# Patient Record
Sex: Female | Born: 1990
Health system: Southern US, Community
[De-identification: ages and names within clinical notes are randomized; demographics above are authoritative.]

## PROBLEM LIST (undated history)

## (undated) DIAGNOSIS — Z8619 Personal history of other infectious and parasitic diseases: Secondary | ICD-10-CM

## (undated) DIAGNOSIS — J45909 Unspecified asthma, uncomplicated: Secondary | ICD-10-CM

## (undated) DIAGNOSIS — F32A Depression, unspecified: Secondary | ICD-10-CM

## (undated) DIAGNOSIS — I10 Essential (primary) hypertension: Secondary | ICD-10-CM

## (undated) HISTORY — DX: Essential (primary) hypertension: I10

## (undated) HISTORY — PX: OTHER SURGICAL HISTORY: SHX169

## (undated) HISTORY — DX: Personal history of other infectious and parasitic diseases: Z86.19

## (undated) HISTORY — DX: Depression, unspecified: F32.A

## (undated) HISTORY — PX: ADENOIDECTOMY: SUR15

## (undated) HISTORY — PX: TONSILLECTOMY: SUR1361

---

## 2018-09-22 ENCOUNTER — Inpatient Hospital Stay (HOSPITAL_COMMUNITY)
Admission: AD | Admit: 2018-09-22 | Discharge: 2018-09-22 | Disposition: A | Discharge status: Home / Self Care | Payer: Self-pay | Attending: Obstetrics and Gynecology | Admitting: Obstetrics and Gynecology

## 2018-09-22 ENCOUNTER — Encounter (HOSPITAL_COMMUNITY): Payer: Self-pay | Admitting: *Deleted

## 2018-09-22 ENCOUNTER — Other Ambulatory Visit: Payer: Self-pay

## 2018-09-22 DIAGNOSIS — Z3A01 Less than 8 weeks gestation of pregnancy: Secondary | ICD-10-CM | POA: Insufficient documentation

## 2018-09-22 DIAGNOSIS — O219 Vomiting of pregnancy, unspecified: Secondary | ICD-10-CM

## 2018-09-22 HISTORY — DX: Unspecified asthma, uncomplicated: J45.909

## 2018-09-22 LAB — URINALYSIS, ROUTINE W REFLEX MICROSCOPIC
Bacteria, UA: NONE SEEN
Bilirubin Urine: NEGATIVE
Glucose, UA: NEGATIVE mg/dL
Hgb urine dipstick: NEGATIVE
Ketones, ur: NEGATIVE mg/dL
Leukocytes,Ua: NEGATIVE
Nitrite: NEGATIVE
Protein, ur: NEGATIVE mg/dL
Specific Gravity, Urine: 1.003 — ABNORMAL LOW (ref 1.005–1.030)
pH: 7 (ref 5.0–8.0)

## 2018-09-22 LAB — POCT PREGNANCY, URINE: Preg Test, Ur: POSITIVE — AB

## 2018-09-22 MED ORDER — DOXYLAMINE-PYRIDOXINE 10-10 MG PO TBEC: 2.0000 | DELAYED_RELEASE_TABLET | Freq: Every day | ORAL | 5 refills | Status: DC

## 2018-09-22 MED ORDER — PROMETHAZINE HCL 25 MG/ML IJ SOLN
25.0000 mg | Freq: Once | INTRAMUSCULAR | Status: AC
  Administered 2018-09-22: 25 mg via INTRAMUSCULAR
  Filled 2018-09-22: qty 1

## 2018-09-22 NOTE — MAU Note (Signed)
Last period was short.  +preg test at HD last Tues.  Denies pain or bleeding.  Nauseated, not throwing up.

## 2018-09-22 NOTE — MAU Provider Note (Signed)
Chief Complaint: Nausea  First Provider Initiated Contact with Patient 09/22/18 [phone]     SUBJECTIVE HPI: Margaret Sanchez is a 28 y.o. G1P0 at 6644w1d by LMP who presents to maternity admissions reporting nausea. Reports sure LMP and that she found out she was pregnant last week. Reports some nausea starting around 0500 this morning. She has not vomited and has been tolerating PO well. Only reports the nausea and some increased saliva production. She was unable to go to work this morning.  She denies cramping, vaginal bleeding, abdominal pain, diarrhea, constipation, back pain, vaginal itching/burning/discharge, urinary symptoms, h/a, dizziness, n/v, or fever/chills.  Past Medical History:  Diagnosis Date  . Asthma    Past Surgical History:  Procedure Laterality Date  . ADENOIDECTOMY    . arm surgery Right   . TONSILLECTOMY     Social History   Socioeconomic History  . Marital status: Single    Spouse name: Not on file  . Number of children: Not on file  . Years of education: Not on file  . Highest education level: Not on file  Occupational History  . Not on file  Social Needs  . Financial resource strain: Not on file  . Food insecurity    Worry: Not on file    Inability: Not on file  . Transportation needs    Medical: Not on file    Non-medical: Not on file  Tobacco Use  . Smoking status: Never Smoker  . Smokeless tobacco: Never Used  Substance and Sexual Activity  . Alcohol use: Not Currently  . Drug use: Never  . Sexual activity: Not on file  Lifestyle  . Physical activity    Days per week: Not on file    Minutes per session: Not on file  . Stress: Not on file  Relationships  . Social Musicianconnections    Talks on phone: Not on file    Gets together: Not on file    Attends religious service: Not on file    Active member of club or organization: Not on file    Attends meetings of clubs or organizations: Not on file    Relationship status: Not on file  . Intimate  partner violence    Fear of current or ex partner: Not on file    Emotionally abused: Not on file    Physically abused: Not on file    Forced sexual activity: Not on file  Other Topics Concern  . Not on file  Social History Narrative  . Not on file   No current facility-administered medications on file prior to encounter.    No current outpatient medications on file prior to encounter.   No Known Allergies  ROS:  Review of Systems All other systems negative unless noted above in HPI.   I have reviewed patient's Past Medical Hx, Surgical Hx, Family Hx, Social Hx, medications and allergies.   Physical Exam   Patient Vitals for the past 24 hrs:  BP Temp Temp src Pulse Resp SpO2 Height Weight  09/22/18 0741 114/70 98.8 F (37.1 C) Oral 63 16 100 % 5\' 3"  (1.6 m) 87 kg   Constitutional: Well-developed, well-nourished female in no acute distress. Obese.  Cardiovascular: normal rate Respiratory: normal effort GI: Abd soft, non-tender. Pos BS x 4 MS: Extremities nontender, no edema, normal ROM Neurologic: Alert and oriented x 4.  GU: Neg CVAT.  LAB RESULTS Results for orders placed or performed during the hospital encounter of 09/22/18 (from the past 24  hour(s))  Urinalysis, Routine w reflex microscopic     Status: Abnormal   Collection Time: 09/22/18  7:39 AM  Result Value Ref Range   Color, Urine YELLOW YELLOW   APPearance CLEAR CLEAR   Specific Gravity, Urine 1.003 (L) 1.005 - 1.030   pH 7.0 5.0 - 8.0   Glucose, UA NEGATIVE NEGATIVE mg/dL   Hgb urine dipstick NEGATIVE NEGATIVE   Bilirubin Urine NEGATIVE NEGATIVE   Ketones, ur NEGATIVE NEGATIVE mg/dL   Protein, ur NEGATIVE NEGATIVE mg/dL   Nitrite NEGATIVE NEGATIVE   Leukocytes,Ua NEGATIVE NEGATIVE   RBC / HPF 0-5 0 - 5 RBC/hpf   WBC, UA 0-5 0 - 5 WBC/hpf   Bacteria, UA NONE SEEN NONE SEEN   Squamous Epithelial / LPF 0-5 0 - 5  Pregnancy, urine POC     Status: Abnormal   Collection Time: 09/22/18  7:39 AM  Result  Value Ref Range   Preg Test, Ur POSITIVE (A) NEGATIVE       IMAGING No results found.  MAU Management/MDM: Orders Placed This Encounter  Procedures  . Urinalysis, Routine w reflex microscopic  . Pregnancy, urine POC  . Discharge patient Discharge disposition: 01-Home or Self Care; Discharge patient date: 09/22/2018    Meds ordered this encounter  Medications  . promethazine (PHENERGAN) injection 25 mg  . Doxylamine-Pyridoxine (DICLEGIS) 10-10 MG TBEC    Sig: Take 2 tablets by mouth at bedtime. If symptoms persist, add one tablet in the morning and one in the afternoon    Dispense:  100 tablet    Refill:  5    Patient presenting with nausea in early pregnancy only. She is without pain, bleeding or any other concern and has not yet vomited. Phenergan given in MAU. Patient reported an improvement in symptoms and felt comfortable discharging home. Partner able to provide transportation home. Diclegis prescribed on discharge. Pt discharged with strict return precautions.  ASSESSMENT 1. Nausea and vomiting in pregnancy prior to [redacted] weeks gestation     PLAN Discharge home Allergies as of 09/22/2018   No Known Allergies     Medication List    TAKE these medications   Doxylamine-Pyridoxine 10-10 MG Tbec Commonly known as: Diclegis Take 2 tablets by mouth at bedtime. If symptoms persist, add one tablet in the morning and one in the afternoon      [place] [facility] Follow up in 4 week(s).   Specialty: Obstetrics and Gynecology Contact information: [address] [place], [place] [phone] [place] [phone] [place], [place] Fellow, [facility] for [facility], [facility] 09/22/2018  9:05 AM

## 2018-10-27 ENCOUNTER — Other Ambulatory Visit (HOSPITAL_COMMUNITY): Payer: Self-pay | Admitting: Nurse Practitioner

## 2018-10-27 DIAGNOSIS — Z369 Encounter for antenatal screening, unspecified: Secondary | ICD-10-CM

## 2018-10-29 LAB — CYTOLOGY - PAP: Pap: NEGATIVE

## 2018-11-04 ENCOUNTER — Encounter (HOSPITAL_COMMUNITY): Payer: Self-pay

## 2018-11-04 ENCOUNTER — Ambulatory Visit (HOSPITAL_COMMUNITY): Payer: Self-pay

## 2018-11-04 ENCOUNTER — Ambulatory Visit (HOSPITAL_COMMUNITY): Payer: Self-pay | Attending: Obstetrics and Gynecology

## 2018-12-28 DIAGNOSIS — J4520 Mild intermittent asthma, uncomplicated: Secondary | ICD-10-CM | POA: Insufficient documentation

## 2018-12-28 DIAGNOSIS — Z6834 Body mass index (BMI) 34.0-34.9, adult: Secondary | ICD-10-CM | POA: Insufficient documentation

## 2018-12-28 HISTORY — DX: Mild intermittent asthma, uncomplicated: J45.20

## 2019-01-29 NOTE — L&D Delivery Note (Addendum)
OB/GYN Faculty Practice Delivery Note  Margaret Sanchez is a 29 y.o. now G1P0100 s/p SVD at [redacted]w[redacted]d who was admitted for IUFD.   ROM: 5h 65m with brown fluid GBS Status: Unknown Maximum Maternal Temperature: 99.0  Delivery Note At 6:20 PM a non-viable female was delivered via Vaginal, Spontaneous (Presentation: Left Occiput Posterior).  APGAR: n/a weight pending.   Placenta status: Spontaneous, Intact via Tomasa Blase, Abnormal, small in size with 3/4 of the diameter covered by blood clot on maternal side.  Cord: burgandy in color, minimal twisting of vessels with the following complications: Short.  Cord clamped then cut by FOB, [place]. CordCord pH: not done  Anesthesia: Epidural Episiotomy: None Lacerations: None Suture Repair: none Est. Blood Loss (mL): 50  Mom to OBSCU.  Baby to with parents then to morgue. Maternal Grandmother, Doris, present immediately after delivery for support of mom and FOB.  Postpartum Planning [x]  Mom stable prior to transfer to antepartum unit.  Mom desires autopsy of baby.  , MSN, CNM 04/20/19, 6:50 PM

## 2019-04-19 ENCOUNTER — Other Ambulatory Visit: Payer: Self-pay

## 2019-04-19 ENCOUNTER — Encounter (HOSPITAL_COMMUNITY): Payer: Self-pay | Admitting: Obstetrics and Gynecology

## 2019-04-19 ENCOUNTER — Inpatient Hospital Stay (HOSPITAL_COMMUNITY)
Admission: AD | Admit: 2019-04-19 | Discharge: 2019-04-22 | DRG: 805 | Disposition: A | Discharge status: Home / Self Care | Payer: Medicaid Other | Attending: Obstetrics and Gynecology | Admitting: Obstetrics and Gynecology

## 2019-04-19 ENCOUNTER — Inpatient Hospital Stay (HOSPITAL_BASED_OUTPATIENT_CLINIC_OR_DEPARTMENT_OTHER): Payer: Medicaid Other

## 2019-04-19 DIAGNOSIS — Z20822 Contact with and (suspected) exposure to covid-19: Secondary | ICD-10-CM | POA: Diagnosis present

## 2019-04-19 DIAGNOSIS — O99324 Drug use complicating childbirth: Secondary | ICD-10-CM | POA: Diagnosis present

## 2019-04-19 DIAGNOSIS — O3680X0 Pregnancy with inconclusive fetal viability, not applicable or unspecified: Secondary | ICD-10-CM

## 2019-04-19 DIAGNOSIS — O364XX0 Maternal care for intrauterine death, not applicable or unspecified: Principal | ICD-10-CM | POA: Diagnosis present

## 2019-04-19 DIAGNOSIS — Z3A36 36 weeks gestation of pregnancy: Secondary | ICD-10-CM

## 2019-04-19 DIAGNOSIS — R030 Elevated blood-pressure reading, without diagnosis of hypertension: Secondary | ICD-10-CM | POA: Diagnosis present

## 2019-04-19 DIAGNOSIS — F1290 Cannabis use, unspecified, uncomplicated: Secondary | ICD-10-CM | POA: Diagnosis present

## 2019-04-19 DIAGNOSIS — O169 Unspecified maternal hypertension, unspecified trimester: Secondary | ICD-10-CM

## 2019-04-19 DIAGNOSIS — O26893 Other specified pregnancy related conditions, third trimester: Secondary | ICD-10-CM | POA: Diagnosis present

## 2019-04-19 DIAGNOSIS — O4593 Premature separation of placenta, unspecified, third trimester: Secondary | ICD-10-CM | POA: Diagnosis present

## 2019-04-19 LAB — COMPREHENSIVE METABOLIC PANEL
ALT: 18 U/L (ref 0–44)
AST: 21 U/L (ref 15–41)
Albumin: 3.2 g/dL — ABNORMAL LOW (ref 3.5–5.0)
Alkaline Phosphatase: 182 U/L — ABNORMAL HIGH (ref 38–126)
Anion gap: 9 (ref 5–15)
BUN: <5 mg/dL — ABNORMAL LOW (ref 6–20)
CO2: 24 mmol/L (ref 22–32)
Calcium: 9.2 mg/dL (ref 8.9–10.3)
Chloride: 107 mmol/L (ref 98–111)
Creatinine, Ser: 0.69 mg/dL (ref 0.44–1.00)
GFR calc Af Amer: >60 mL/min (ref >60)
GFR calc non Af Amer: >60 mL/min (ref >60)
Glucose, Bld: 82 mg/dL (ref 70–99)
Potassium: 4 mmol/L (ref 3.5–5.1)
Sodium: 140 mmol/L (ref 135–145)
Total Bilirubin: 0.5 mg/dL (ref 0.3–1.2)
Total Protein: 7.5 g/dL (ref 6.5–8.1)

## 2019-04-19 LAB — CBC
HCT: 40.9 % (ref 36.0–46.0)
Hemoglobin: 13.2 g/dL (ref 12.0–15.0)
MCH: 27.1 pg (ref 26.0–34.0)
MCHC: 32.3 g/dL (ref 30.0–36.0)
MCV: 84 fL (ref 80.0–100.0)
Platelets: 430 10*3/uL — ABNORMAL HIGH (ref 150–400)
RBC: 4.87 MIL/uL (ref 3.87–5.11)
RDW: 13.9 % (ref 11.5–15.5)
WBC: 9.8 10*3/uL (ref 4.0–10.5)
nRBC: 0 % (ref 0.0–0.2)

## 2019-04-19 LAB — SARS CORONAVIRUS 2 (TAT 6-24 HRS): SARS Coronavirus 2: NEGATIVE

## 2019-04-19 LAB — ABO/RH: ABO/RH(D): O POS

## 2019-04-19 LAB — TYPE AND SCREEN
ABO/RH(D): O POS
Antibody Screen: NEGATIVE

## 2019-04-19 LAB — PROTEIN / CREATININE RATIO, URINE
Creatinine, Urine: 98.8 mg/dL
Total Protein, Urine: <6 mg/dL

## 2019-04-19 IMAGING — US US MFM OB LIMITED
1 series · 11 of 11 positions shown · non-contrast
Comparison: none

1  US MFM OB LIMITED                    76815.01     [facility]
 ----------------------------------------------------------------------

Indications
  36 weeks gestation of pregnancy
  Unable to hear fetal heart tones as reason     O76
  for ultrasound
Fetal Evaluation
 Num Of Fetuses:         1
 Cardiac Activity:       Absent
 Presentation:           Cephalic
 Placenta:               Posterior
OB History
 Gravidity:    1
Gestational Age
 LMP:           36w 0d        Date:  [DATE]                 EDD:   [DATE]
 Best:          36w 0d     Det. By:  LMP  ([DATE])          EDD:   [DATE]
Impression
 Patient was evaluated at the [facility] after intrauterine fetal
 demise was suspected on office ultrasound.
 On ultrasound, unfortunately, fetal heart activity was absent.
 Minimal pleural effusion is seen. Cephalic presentation.

[Series 1: us mfm ob limited · 11 acquisitions, 11 frames shown]
[im 1/11]
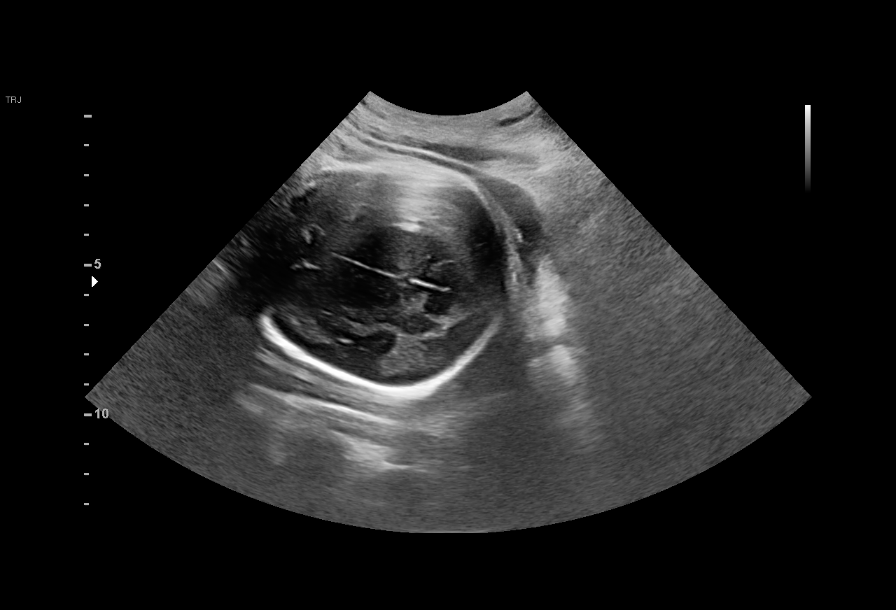
[im 2/11]
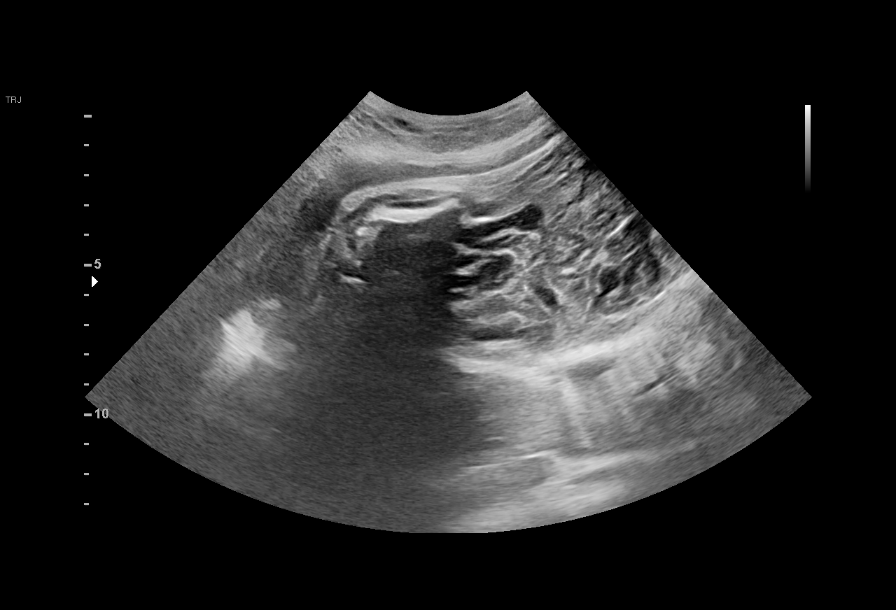
[im 3/11]
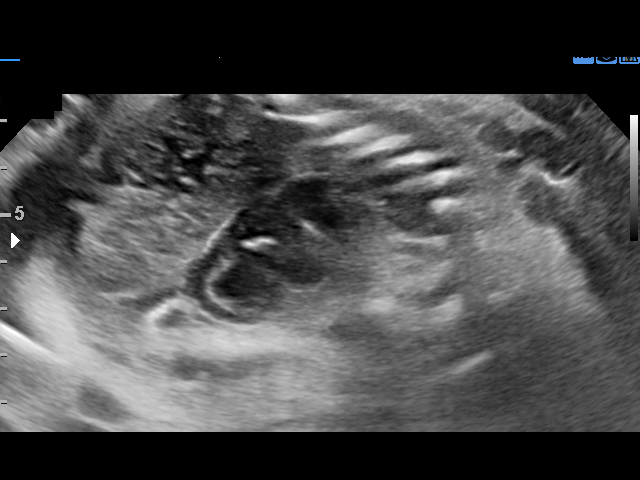
[im 4/11]
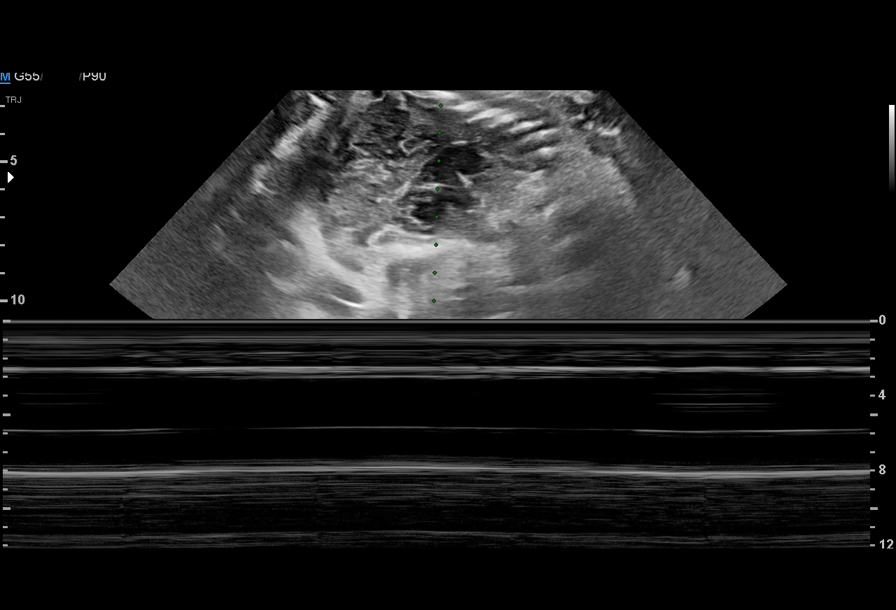
[im 5/11]
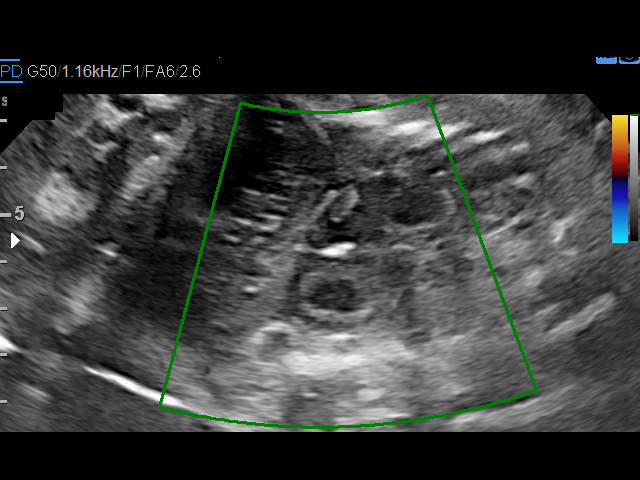
[im 6/11]
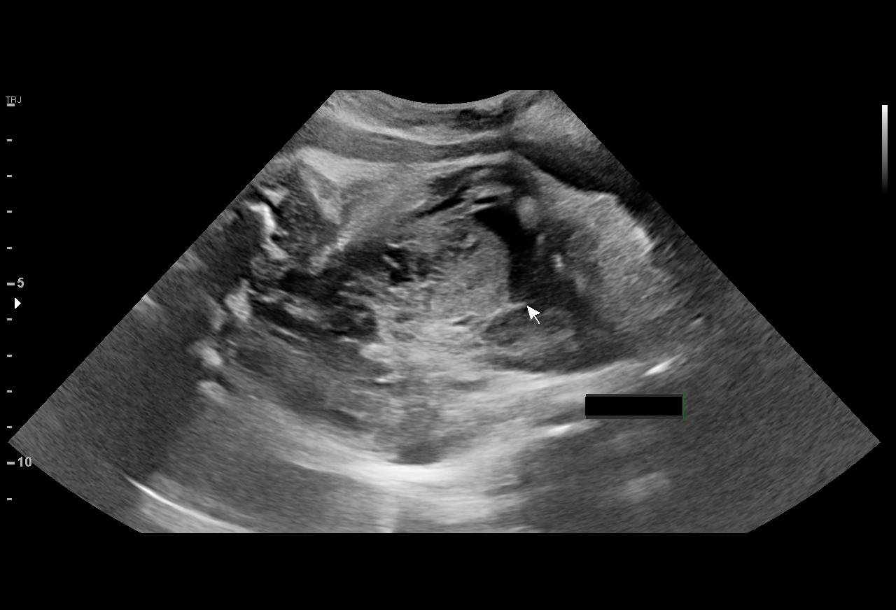
[im 7/11]
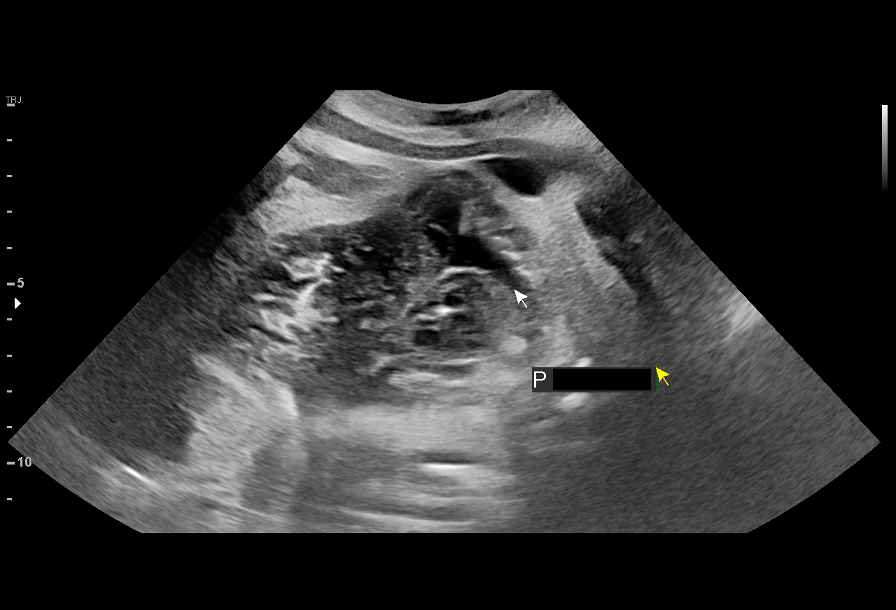
[im 8/11]
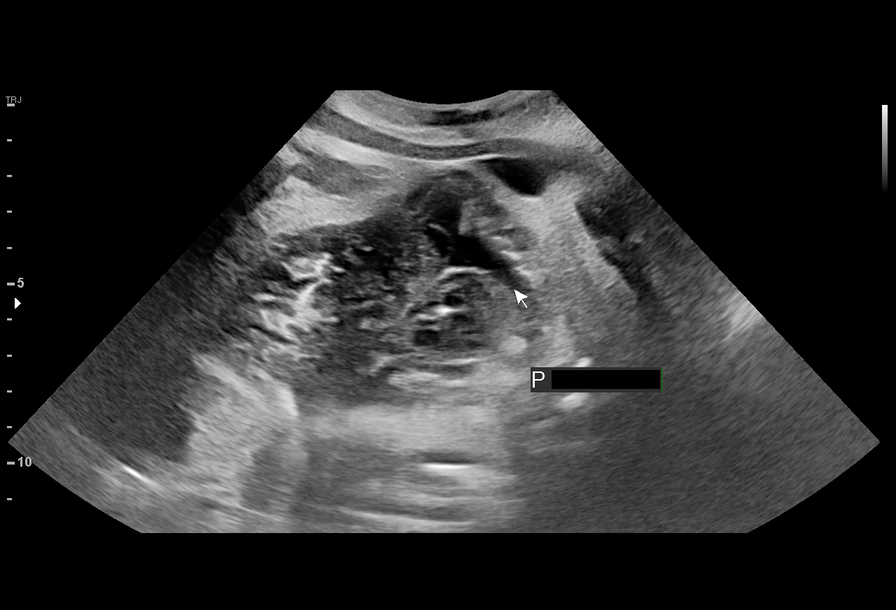
[im 9/11]
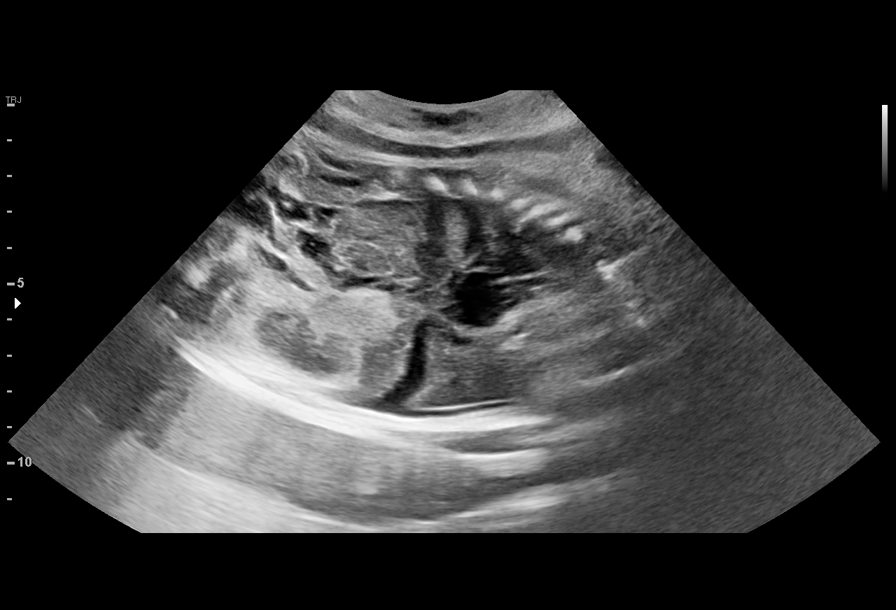
[im 10/11]
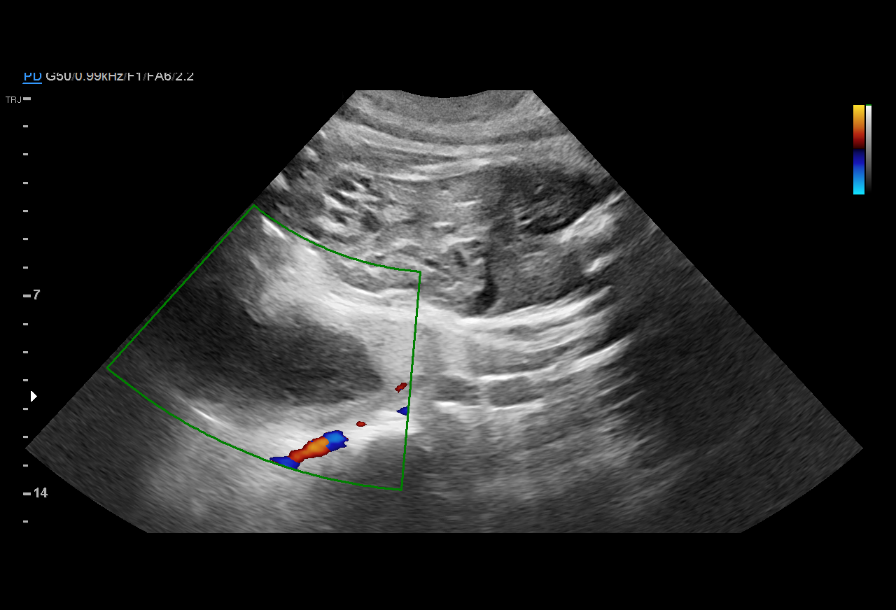
[im 11/11]
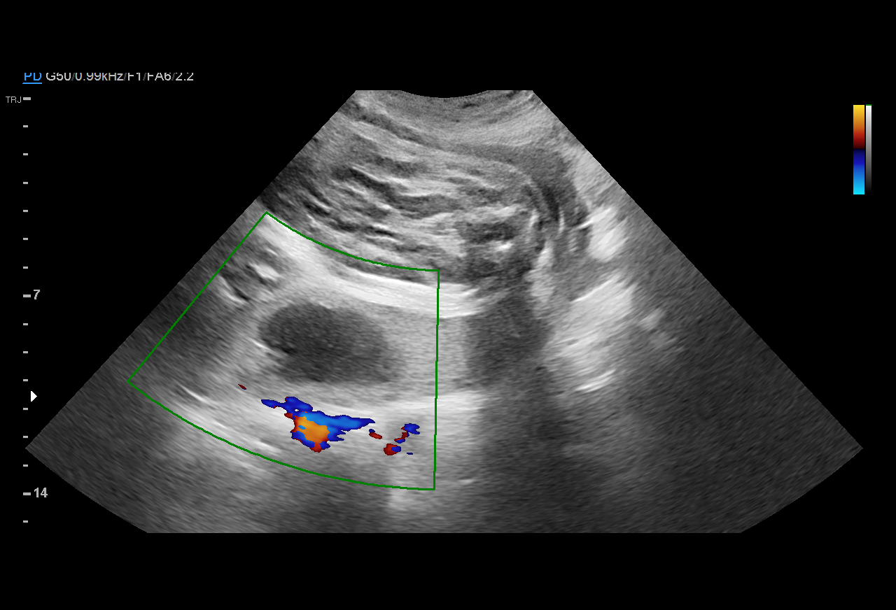

[11 of 11 positions shown; findings below may reference images not displayed]

IMPRESSION: Fetal Demise.
Recommendations

 -CMV/Toxo screening to be included in stillbirth evaluation.
                 [facility]

## 2019-04-19 MED ORDER — MISOPROSTOL 200 MCG PO TABS
400.0000 ug | ORAL_TABLET | ORAL | Status: DC | PRN
  Administered 2019-04-20: 400 ug via VAGINAL
  Filled 2019-04-19: qty 2

## 2019-04-19 MED ORDER — LABETALOL HCL 5 MG/ML IV SOLN: 20.0000 mg | INTRAVENOUS | Status: DC | PRN

## 2019-04-19 MED ORDER — HYDRALAZINE HCL 20 MG/ML IJ SOLN: 5.0000 mg | INTRAMUSCULAR | Status: DC | PRN

## 2019-04-19 MED ORDER — FENTANYL CITRATE (PF) 100 MCG/2ML IJ SOLN
100.0000 ug | INTRAMUSCULAR | Status: DC | PRN
  Administered 2019-04-20 (×6): 100 ug via INTRAVENOUS
  Filled 2019-04-19 (×6): qty 2

## 2019-04-19 MED ORDER — OXYCODONE-ACETAMINOPHEN 5-325 MG PO TABS: 2.0000 | ORAL_TABLET | ORAL | Status: DC | PRN

## 2019-04-19 MED ORDER — ZOLPIDEM TARTRATE 5 MG PO TABS
5.0000 mg | ORAL_TABLET | Freq: Every evening | ORAL | Status: DC | PRN
  Administered 2019-04-19: 5 mg via ORAL
  Filled 2019-04-19: qty 1

## 2019-04-19 MED ORDER — LACTATED RINGERS IV SOLN: INTRAVENOUS | Status: DC

## 2019-04-19 MED ORDER — MISOPROSTOL 200 MCG PO TABS
200.0000 ug | ORAL_TABLET | ORAL | Status: DC | PRN
  Administered 2019-04-19: 200 ug via VAGINAL
  Filled 2019-04-19: qty 1

## 2019-04-19 MED ORDER — OXYCODONE-ACETAMINOPHEN 5-325 MG PO TABS: 1.0000 | ORAL_TABLET | ORAL | Status: DC | PRN

## 2019-04-19 MED ORDER — ACETAMINOPHEN 325 MG PO TABS: 650.0000 mg | ORAL_TABLET | ORAL | Status: DC | PRN

## 2019-04-19 MED ORDER — OXYTOCIN 40 UNITS IN NORMAL SALINE INFUSION - SIMPLE MED: 2.5000 [IU]/h | INTRAVENOUS | Status: DC

## 2019-04-19 MED ORDER — MISOPROSTOL 200 MCG PO TABS
ORAL_TABLET | ORAL | Status: AC
  Administered 2019-04-19: 23:00:00 400 ug
  Filled 2019-04-19: qty 3

## 2019-04-19 MED ORDER — SOD CITRATE-CITRIC ACID 500-334 MG/5ML PO SOLN: 30.0000 mL | ORAL | Status: DC | PRN

## 2019-04-19 MED ORDER — HYDRALAZINE HCL 20 MG/ML IJ SOLN: 10.0000 mg | INTRAMUSCULAR | Status: DC | PRN

## 2019-04-19 MED ORDER — LABETALOL HCL 5 MG/ML IV SOLN: 40.0000 mg | INTRAVENOUS | Status: DC | PRN

## 2019-04-19 MED ORDER — ONDANSETRON HCL 4 MG/2ML IJ SOLN
4.0000 mg | Freq: Four times a day (QID) | INTRAMUSCULAR | Status: DC | PRN
  Administered 2019-04-20: 4 mg via INTRAVENOUS
  Filled 2019-04-19: qty 2

## 2019-04-19 MED ORDER — LIDOCAINE HCL (PF) 1 % IJ SOLN: 30.0000 mL | INTRAMUSCULAR | Status: DC | PRN

## 2019-04-19 MED ORDER — LACTATED RINGERS IV SOLN: 500.0000 mL | INTRAVENOUS | Status: DC | PRN

## 2019-04-19 MED ORDER — OXYTOCIN BOLUS FROM INFUSION
500.0000 mL | Freq: Once | INTRAVENOUS | Status: AC
  Administered 2019-04-20: 500 mL via INTRAVENOUS

## 2019-04-19 NOTE — H&P (Signed)
Margaret Sanchez is a 29 y.o. female G1P0 at 36.0wks presenting for IOL due to IUFD, initially dx with no FHT at her regular OB appt today, followed by formal U/S upon arrival to MAU. States she had been feeling FM regularly. Denies pain, ctx, leaking or bldg. No H/A, N/V or visual disturbances. She had some initial elevated BPs in MAU with some intermittent normal BPs. Her preg has been followed by the [facility] since 11wks and has been remarkable for:  # +THC in Sept 2020 & Feb 2021 # +chlamydia in Sept with neg TOC in Feb # hx asthma (hasn't required tx recently) # care in [place], [place] from Nov-Feb (can see visits x 2 in [place])  OB History    Gravida  1   Para      Term      Preterm      AB      Living        SAB      TAB      Ectopic      Multiple      Live Births             Past Medical History:  Diagnosis Date  . Asthma    Past Surgical History:  Procedure Laterality Date  . ADENOIDECTOMY    . arm surgery Right   . TONSILLECTOMY     Family History: family history is not on file. Social History:  reports that she has never smoked. She has never used smokeless tobacco. She reports previous alcohol use. She reports that she does not use drugs.     Maternal Diabetes: No Genetic Screening: Declined Maternal Ultrasounds/Referrals: Normal Fetal Ultrasounds or other Referrals:  None Maternal Substance Abuse:  Yes:  Type: Marijuana Significant Maternal Medications:  None Significant Maternal Lab Results:  None Other Comments:  IUFD dx today  Review of Systems History   Blood pressure 135/81, pulse 80, temperature 98.4 F (36.9 C), temperature source Oral, resp. rate 18, last menstrual period 08/10/2018, SpO2 97 %. Exam Physical Exam  Constitutional: She is oriented to person, place, and time. She appears well-developed.  HENT:  Head: Normocephalic.  Cardiovascular: Normal rate.  Respiratory: Effort normal.  Musculoskeletal:        General:  Normal range of motion.     Cervical back: Normal range of motion.  Neurological: She is alert and oriented to person, place, and time.  Skin: Skin is warm and dry.  Psychiatric: She has a normal mood and affect. Her behavior is normal. Thought content normal.    Prenatal labs: ABO, Rh:   O positive (10/26/18) Antibody:   negative (10/26/18) Rubella:   immune 10/26/18) RPR:   NR (10/26/18) HBsAg:   NR (10/26/18) HIV:   NR (10/26/18) GBS:   unknown  MFM U/S: absent cardiac activity; cepahlic presentation, post placenta, of note: ascites and pleural effusion noted  Urine P/C ratio: too low too calculate  CBC    Component Value Date/Time   WBC 9.8 04/19/2019 1640   RBC 4.87 04/19/2019 1640   HGB 13.2 04/19/2019 1640   HCT 40.9 04/19/2019 1640   PLT 430 (H) 04/19/2019 1640   MCV 84.0 04/19/2019 1640   MCH 27.1 04/19/2019 1640   MCHC 32.3 04/19/2019 1640   RDW 13.9 04/19/2019 1640   CMP     Component Value Date/Time   NA 140 04/19/2019 1640   K 4.0 04/19/2019 1640   CL 107 04/19/2019 1640   CO2 24  04/19/2019 1640   GLUCOSE 82 04/19/2019 1640   BUN <5 (L) 04/19/2019 1640   CREATININE 0.69 04/19/2019 1640   CALCIUM 9.2 04/19/2019 1640   PROT 7.5 04/19/2019 1640   ALBUMIN 3.2 (L) 04/19/2019 1640   AST 21 04/19/2019 1640   ALT 18 04/19/2019 1640   ALKPHOS 182 (H) 04/19/2019 1640   BILITOT 0.5 04/19/2019 1640   GFRNONAA >60 04/19/2019 1640   GFRAA >60 04/19/2019 1640    Assessment/Plan: IUFD@36 .0wks Elevated BPs- normal labs Unfavorable cx  Admit to Labor and Delivery Will continue to watch BPs> dx of gHTN if elevated values persist >4hrs apart Pt appropriately grieved- condolences provided to pt and family, including chaplain services Will plan on cx ripening with cytotec when pt ready UDS pending   Arabella Merles CNM 04/19/2019, 5:32 PM

## 2019-04-19 NOTE — Progress Notes (Signed)
Patient Vitals for the past 4 hrs:  BP Temp Temp src Pulse Resp  04/19/19 2254 129/79 -- -- (!) 59 --  04/19/19 1951 (!) 149/91 98.7 F (37.1 C) Oral 62 16   Sleeping.  Ctx q 2 minutes or so. Cx anterior/1/thick/-2.  Cytotec 400 mcg placed in posterior vaginal fornix.

## 2019-04-19 NOTE — MAU Note (Signed)
Patient repetitively stating she "Doesn't want to be here anymore" grandmother stated before she left that the patient states she wants to give up on life. Provider notified. Social work consulted.

## 2019-04-19 NOTE — MAU Note (Signed)
Ultrasound at bedside for formal evaluation

## 2019-04-19 NOTE — MAU Note (Signed)
Donia Ast, NP at bedside for evaluation of FHR with informal bedside US

## 2019-04-19 NOTE — MAU Provider Note (Signed)
History     CSN: [id number]  Arrival date and time: 04/19/19 1557   First Provider Initiated Contact with Patient 04/19/19 [phone]      Chief Complaint  Patient presents with  . sent from health dep., could not find fetal heart tones   Ms. Margaret Sanchez is a 29 y.o. G1P0 at [redacted]w[redacted]d who presents to MAU for absent fetal heart tones. Patient was seen at [facility] today for regular prenatal visit and reported normal fetal movement, but they were unable to find fetal heart tones and did not have an ultrasonographer present on site today, so patient was sent to MAU for evaluation.  Pt does not have any additional concerns today.  Patient also with elevated pressure on admission to MAU.   OB History    Gravida  1   Para      Term      Preterm      AB      Living        SAB      TAB      Ectopic      Multiple      Live Births              Past Medical History:  Diagnosis Date  . Asthma     Past Surgical History:  Procedure Laterality Date  . ADENOIDECTOMY    . arm surgery Right   . TONSILLECTOMY      History reviewed. No pertinent family history.  Social History   Tobacco Use  . Smoking status: Never Smoker  . Smokeless tobacco: Never Used  Substance Use Topics  . Alcohol use: Not Currently  . Drug use: Never    Allergies: No Known Allergies  Medications Prior to Admission  Medication Sig Dispense Refill Last Dose  . Doxylamine-Pyridoxine (DICLEGIS) 10-10 MG TBEC Take 2 tablets by mouth at bedtime. If symptoms persist, add one tablet in the morning and one in the afternoon 100 tablet 5     Review of Systems  Constitutional: Negative for chills, diaphoresis, fatigue and fever.  Eyes: Negative for visual disturbance.  Respiratory: Negative for shortness of breath.   Cardiovascular: Negative for chest pain.  Gastrointestinal: Negative for abdominal pain, constipation, diarrhea, nausea and vomiting.  Genitourinary: Negative for dysuria, flank pain,  frequency, pelvic pain, urgency, vaginal bleeding and vaginal discharge.  Neurological: Negative for dizziness, weakness, light-headedness and headaches.   Physical Exam   Blood pressure (!) 130/108, pulse 77, temperature 98.4 F (36.9 C), temperature source Oral, resp. rate 18, last menstrual period 08/10/2018, SpO2 97 %.  Patient Vitals for the past 24 hrs:  BP Temp Temp src Pulse Resp SpO2  04/19/19 1746 (!) 130/108 -- -- 77 -- --  04/19/19 1731 112/63 -- -- (!) 52 -- --  04/19/19 1716 135/81 -- -- 80 -- --  04/19/19 1701 (!) 158/110 -- -- 82 -- --  04/19/19 1646 138/87 -- -- 65 -- --  04/19/19 1631 (!) 142/93 -- -- 63 -- --  04/19/19 1616 (!) 150/96 -- -- 65 -- --  04/19/19 1609 (!) 165/100 98.4 F (36.9 C) Oral 64 18 97 %   Physical Exam  Constitutional: She is oriented to person, place, and time. She appears well-developed and well-nourished. No distress.  HENT:  Head: Normocephalic and atraumatic.  Respiratory: Effort normal.  GI: Soft. She exhibits no distension and no mass. There is no abdominal tenderness. There is no rebound and no guarding.  Neurological: She is  alert and oriented to person, place, and time.  Skin: Skin is warm and dry. She is not diaphoretic.  Psychiatric: She has a normal mood and affect. Her behavior is normal. Judgment and thought content normal.   Results for orders placed or performed during the hospital encounter of 04/19/19 (from the past 24 hour(s))  Protein / creatinine ratio, urine     Status: None (Preliminary result)   Collection Time: 04/19/19  4:30 PM  Result Value Ref Range   Creatinine, Urine 98.80 mg/dL   Total Protein, Urine PENDING mg/dL   Protein Creatinine Ratio PENDING 0.00 - 0.15 mg/mg[Cre]  Comprehensive metabolic panel     Status: Abnormal   Collection Time: 04/19/19  4:40 PM  Result Value Ref Range   Sodium 140 135 - 145 mmol/L   Potassium 4.0 3.5 - 5.1 mmol/L   Chloride 107 98 - 111 mmol/L   CO2 24 22 - 32 mmol/L    Glucose, Bld 82 70 - 99 mg/dL   BUN <5 (L) 6 - 20 mg/dL   Creatinine, Ser 1.61 0.44 - 1.00 mg/dL   Calcium 9.2 8.9 - 09.6 mg/dL   Total Protein 7.5 6.5 - 8.1 g/dL   Albumin 3.2 (L) 3.5 - 5.0 g/dL   AST 21 15 - 41 U/L   ALT 18 0 - 44 U/L   Alkaline Phosphatase 182 (H) 38 - 126 U/L   Total Bilirubin 0.5 0.3 - 1.2 mg/dL   GFR calc non Af Amer >60 >60 mL/min   GFR calc Af Amer >60 >60 mL/min   Anion gap 9 5 - 15  CBC     Status: Abnormal   Collection Time: 04/19/19  4:40 PM  Result Value Ref Range   WBC 9.8 4.0 - 10.5 K/uL   RBC 4.87 3.87 - 5.11 MIL/uL   Hemoglobin 13.2 12.0 - 15.0 g/dL   HCT 04.5 40.9 - 81.1 %   MCV 84.0 80.0 - 100.0 fL   MCH 27.1 26.0 - 34.0 pg   MCHC 32.3 30.0 - 36.0 g/dL   RDW 91.4 78.2 - 95.6 %   Platelets 430 (H) 150 - 400 K/uL   nRBC 0.0 0.0 - 0.2 %  Type and screen [facility]     Status: None   Collection Time: 04/19/19  4:40 PM  Result Value Ref Range   ABO/RH(D) O POS    Antibody Screen NEG    Sample Expiration      04/22/2019,2359 Performed at [facility], [address] [place], [place] [address]   ABO/Rh     Status: None (Preliminary result)   Collection Time: 04/19/19  4:40 PM  Result Value Ref Range   ABO/RH(D)      O POS Performed at [facility], [address], [place], [place] [address]    No results found.  MAU Course  Procedures  MDM -fetal demise at 36 weeks with elevated blood pressures in MAU -preeclampsia protocol ordered and labs drawn -Pt informed that the ultrasound is considered a limited OB ultrasound and is not intended to be a complete ultrasound exam.  Patient also informed that the ultrasound is not being completed with the intent of assessing for fetal or placental anomalies or any pelvic abnormalities. Explained that the purpose of today's ultrasound is to assess for viability (no cardiac activity identified).  Patient acknowledges the purpose of the exam and the limitations of the  study.   -formal ultrasound confirms  absent FHR, cephalic presentation -CBC: platelets 430, otherwise WNL -CMP: pending at time of transfer to L&D -PCr: pending at time of transfer to L&D -patient tearful and upset, mother requesting Chaplain who was called by RN and will arrive after completing services at another hospital -social work consult ordered and patient stating that she "wants to give up" -consulted with Dr. Debroah Loop about second severe range blood pressure taken during height of patient distress, per Dr. Debroah Loop, do not need to treat with protocol at this time given intermittent normal pressures -admit to L&D for induction  Orders Placed This Encounter  Procedures  . SARS CORONAVIRUS 2 (TAT 6-24 HRS) Nasopharyngeal Nasopharyngeal Swab    Standing Status:   Standing    Number of Occurrences:   1    Order Specific Question:   Is this test for diagnosis or screening    Answer:   Screening    Order Specific Question:   Symptomatic for COVID-19 as defined by CDC    Answer:   No    Order Specific Question:   Hospitalized for COVID-19    Answer:   No    Order Specific Question:   Admitted to ICU for COVID-19    Answer:   No    Order Specific Question:   Previously tested for COVID-19    Answer:   No    Order Specific Question:   Resident in a congregate (group) care setting    Answer:   Unknown    Order Specific Question:   Employed in healthcare setting    Answer:   Unknown    Order Specific Question:   Pregnant    Answer:   Yes    Order Specific Question:   Pre-procedural testing    Answer:   Yes  . [place] MFM OB LIMITED    Standing Status:   Standing    Number of Occurrences:   1    Order Specific Question:   Symptom/Reason for Exam    Answer:   Pregnancy with uncertain fetal viability [768115]  . Comprehensive metabolic panel    Standing Status:   Standing    Number of Occurrences:   1  . CBC    Standing Status:   Standing    Number of Occurrences:   1  . Protein /  creatinine ratio, urine    Standing Status:   Standing    Number of Occurrences:   1  . RPR    Standing Status:   Standing    Number of Occurrences:   1  . Diet regular Room service appropriate? Yes; Fluid consistency: Thin    Standing Status:   Standing    Number of Occurrences:   1    Order Specific Question:   Room service appropriate?    Answer:   Yes    Order Specific Question:   Fluid consistency:    Answer:   Thin  . Notify Physician    Confirmatory reading of BP> 160/110 15 minutes later    Standing Status:   Standing    Number of Occurrences:   1    Order Specific Question:   Notify Physician    Answer:   Temp greater than or equal to 100.4    Order Specific Question:   Notify Physician    Answer:   RR greater than 24 or less than 10    Order Specific Question:   Notify Physician    Answer:   HR greater  than 120 or less than 50    Order Specific Question:   Notify Physician    Answer:   SBP greater than 160 mmHG or less than 80 mmHG    Order Specific Question:   Notify Physician    Answer:   DBP greater than 110 mmHG or less than 45 mmHG    Order Specific Question:   Notify Physician    Answer:   Urinary output is less than 165ml for any 4 hour period  . Measure blood pressure    10 minutes after giving labetalol 40 MG IV dose.  Call MD if SBP >/= 160 or DBP >/= 110.    Standing Status:   Standing    Number of Occurrences:   1  . Vitals signs per unit policy    Standing Status:   Standing    Number of Occurrences:   1  . Notify Physician    Standing Status:   Standing    Number of Occurrences:   1    Order Specific Question:   Notify Physician    Answer:   for vaginal bleeding    Order Specific Question:   Notify Physician    Answer:   for acute abdominal pain    Order Specific Question:   Notify Physician    Answer:   for temperature >/= 100.4 F    Order Specific Question:   Notify Physician    Answer:   for significant change in vital signs    Order Specific  Question:   Notify Physician    Answer:   for non-reassuring fetal heart rate pattern    Order Specific Question:   Notify Physician    Answer:   for imminent delivery or failure to progress  . Fetal monitoring per unit policy    Standing Status:   Standing    Number of Occurrences:   1  . Activity as tolerated    Standing Status:   Standing    Number of Occurrences:   1  . Cervical Exam    Unless contraindicated, every 1-2 hours in active labor, or at nurse's discretion    Standing Status:   Standing    Number of Occurrences:   1  . Measure blood pressure post delivery every 15 min x 1 hour then every 30 min x 1 hour    Standing Status:   Standing    Number of Occurrences:   1  . Fundal check post delivery every 15 min x 1 hour then every 30 min x 1 hour    Standing Status:   Standing    Number of Occurrences:   1  . If Rapid HIV test positive or known HIV positive: initiate AZT orders    Standing Status:   Standing    Number of Occurrences:   1  . May in and out cath x 2 for inability to void    Standing Status:   Standing    Number of Occurrences:   [phone]  . Insert foley catheter    1) If straight catheterized greater than 2 times or patient unable to void post epidural placement.  2) PRN for bladder distention.      Standing Status:   Standing    Number of Occurrences:   1  . Discontinue foley prior to vaginal delivery    Standing Status:   Standing    Number of Occurrences:   1  . Initiate Carrier Fluid Protocol  Standing Status:   Standing    Number of Occurrences:   1  . Initiate Oral Care Protocol    Standing Status:   Standing    Number of Occurrences:   1  . Order Rapid HIV per protocol if no results on chart    Standing Status:   Standing    Number of Occurrences:   1  . Full code    Standing Status:   Standing    Number of Occurrences:   1  . Consult to Transition of Care Team    Fetal demise @36wks .    Standing Status:   Standing    Number of  Occurrences:   1    Order Specific Question:   Reason for Consult:    Answer:   Crisis counseling  . Nitrous Oxide 50%/Oxygen 50%    CONTRAINDICATIONS  1. Patients who cannot hold their own facemask.  2. Patients who are acutely intoxicated or have impaired consciousness.  3. Patient with known pernicious anemia or history of B12 deficiency  (Patient ttaking B12 as a nutritional supplement without a deficiency is not  Contraindicated from this therapy).  4. Patients with a known history of MTHFR gene mutation, pneumothorax, bowel  obstruction, increased intracranial pressure, intra-ocular surgery, or any  significant cardiac disease, including but not limited to valve abnormalities, CHF  and pulmonary hypertension.  5. Impaired oxygenation defined as oxygen saturation less than 95% on room air.    6. Patients physically in watertub for labor/birth.   7. Any other indication based on OB provider discretion.        Standing Status:   Standing    Number of Occurrences:   [phone]    Order Specific Question:   Route    Answer:   Inhalation    Order Specific Question:   Concentration    Answer:   50% Gas    Order Specific Question:   Indication    Answer:   Labor pain analgesic  . Type and screen [facility]    [facility]     Standing Status:   Standing    Number of Occurrences:   1  . ABO/Rh    Standing Status:   Standing    Number of Occurrences:   1  . Insert and maintain IV Line    Standing Status:   Standing    Number of Occurrences:   1  . Admit to Inpatient (patient's expected length of stay will be greater than 2 midnights or inpatient only procedure)    Standing Status:   Standing    Number of Occurrences:   1    Order Specific Question:   Hospital Area    Answer:   [facility] [[id number]]    Order Specific Question:   Level of Care    Answer:   Labor and Delivery [101]    Order Specific Question:   Covid Evaluation     Answer:   Asymptomatic Screening Protocol (No Symptoms)    Order Specific Question:   Diagnosis    Answer:   Intrauterine fetal death at 20 weeks or more of gestation [facility]    Order Specific Question:   Admitting Physician    Answer:   [[id number]] [[id number]]    Order Specific Question:   Attending Physician    Answer:   Adam Phenix [phone]    Order Specific Question:   Estimated length of stay  Answer:   past midnight tomorrow    Order Specific Question:   Certification:    Answer:   I certify this patient will need inpatient services for at least 2 midnights   Meds ordered this encounter  Medications  . AND Linked Order Group   . hydrALAZINE (APRESOLINE) injection 5 mg   . hydrALAZINE (APRESOLINE) injection 10 mg   . labetalol (NORMODYNE) injection 20 mg   . labetalol (NORMODYNE) injection 40 mg  . lactated ringers infusion  . oxytocin (PITOCIN) IV BOLUS FROM BAG  . oxytocin (PITOCIN) IV infusion 40 units in NS 1000 mL - Premix  . lactated ringers infusion 500-1,000 mL  . acetaminophen (TYLENOL) tablet 650 mg  . oxyCODONE-acetaminophen (PERCOCET/ROXICET) 5-325 MG per tablet 1 tablet  . oxyCODONE-acetaminophen (PERCOCET/ROXICET) 5-325 MG per tablet 2 tablet  . fentaNYL (SUBLIMAZE) injection 100 mcg  . ondansetron (ZOFRAN) injection 4 mg  . sodium citrate-citric acid (ORACIT) solution 30 mL  . lidocaine (PF) (XYLOCAINE) 1 % injection 30 mL  . misoprostol (CYTOTEC) tablet 200 mcg    Assessment and Plan   1. Fetal demise, greater than 22 weeks, antepartum, single or unspecified fetus   2. Pregnancy with uncertain fetal viability   3. Pregnancy with uncertain fetal viability, single or unspecified fetus   4. [redacted] weeks gestation of pregnancy   5. Elevated blood pressure affecting pregnancy, antepartum    -admit to L&D for induction  Odie Seraicole E Manju Kulkarni 04/19/2019, 6:13 PM

## 2019-04-19 NOTE — MAU Note (Signed)
Pt was sent over from the health department, two providers were unable to find fetal heart tones by doppler.

## 2019-04-20 ENCOUNTER — Inpatient Hospital Stay (HOSPITAL_COMMUNITY): Payer: Medicaid Other | Admitting: Anesthesiology

## 2019-04-20 ENCOUNTER — Encounter (HOSPITAL_COMMUNITY): Payer: Self-pay | Admitting: Obstetrics & Gynecology

## 2019-04-20 DIAGNOSIS — O364XX0 Maternal care for intrauterine death, not applicable or unspecified: Secondary | ICD-10-CM

## 2019-04-20 DIAGNOSIS — Z3A36 36 weeks gestation of pregnancy: Secondary | ICD-10-CM

## 2019-04-20 LAB — RAPID URINE DRUG SCREEN, HOSP PERFORMED
Amphetamines: NOT DETECTED
Barbiturates: NOT DETECTED
Benzodiazepines: NOT DETECTED
Cocaine: NOT DETECTED
Opiates: NOT DETECTED
Tetrahydrocannabinol: POSITIVE — AB

## 2019-04-20 LAB — RPR: RPR Ser Ql: NONREACTIVE

## 2019-04-20 MED ORDER — ONDANSETRON HCL 4 MG PO TABS: 4.0000 mg | ORAL_TABLET | ORAL | Status: DC | PRN

## 2019-04-20 MED ORDER — MISOPROSTOL 50MCG HALF TABLET
50.0000 ug | ORAL_TABLET | Freq: Once | ORAL | Status: AC
  Administered 2019-04-20: 50 ug via BUCCAL
  Filled 2019-04-20: qty 1

## 2019-04-20 MED ORDER — PRENATAL MULTIVITAMIN CH
1.0000 | ORAL_TABLET | Freq: Every day | ORAL | Status: DC
  Administered 2019-04-21: 1 via ORAL
  Filled 2019-04-20: qty 1

## 2019-04-20 MED ORDER — FENTANYL-BUPIVACAINE-NACL 0.5-0.125-0.9 MG/250ML-% EP SOLN
12.0000 mL/h | EPIDURAL | Status: DC | PRN
  Filled 2019-04-20: qty 250

## 2019-04-20 MED ORDER — ALBUTEROL SULFATE (2.5 MG/3ML) 0.083% IN NEBU: 3.0000 mL | INHALATION_SOLUTION | Freq: Four times a day (QID) | RESPIRATORY_TRACT | Status: DC | PRN

## 2019-04-20 MED ORDER — PHENYLEPHRINE 40 MCG/ML (10ML) SYRINGE FOR IV PUSH (FOR BLOOD PRESSURE SUPPORT): 80.0000 ug | PREFILLED_SYRINGE | INTRAVENOUS | Status: DC | PRN

## 2019-04-20 MED ORDER — BENZOCAINE-MENTHOL 20-0.5 % EX AERO: 1.0000 "application " | INHALATION_SPRAY | CUTANEOUS | Status: DC | PRN

## 2019-04-20 MED ORDER — ZOLPIDEM TARTRATE 5 MG PO TABS: 5.0000 mg | ORAL_TABLET | Freq: Every evening | ORAL | Status: DC | PRN

## 2019-04-20 MED ORDER — DIBUCAINE (PERIANAL) 1 % EX OINT: 1.0000 "application " | TOPICAL_OINTMENT | CUTANEOUS | Status: DC | PRN

## 2019-04-20 MED ORDER — WITCH HAZEL-GLYCERIN EX PADS: 1.0000 "application " | MEDICATED_PAD | CUTANEOUS | Status: DC | PRN

## 2019-04-20 MED ORDER — ACETAMINOPHEN 325 MG PO TABS: 650.0000 mg | ORAL_TABLET | ORAL | Status: DC | PRN

## 2019-04-20 MED ORDER — DIPHENHYDRAMINE HCL 25 MG PO CAPS: 25.0000 mg | ORAL_CAPSULE | Freq: Four times a day (QID) | ORAL | Status: DC | PRN

## 2019-04-20 MED ORDER — TETANUS-DIPHTH-ACELL PERTUSSIS 5-2.5-18.5 LF-MCG/0.5 IM SUSP: 0.5000 mL | Freq: Once | INTRAMUSCULAR | Status: DC

## 2019-04-20 MED ORDER — SENNOSIDES-DOCUSATE SODIUM 8.6-50 MG PO TABS
2.0000 | ORAL_TABLET | ORAL | Status: DC
  Administered 2019-04-21 (×2): 2 via ORAL
  Filled 2019-04-20 (×2): qty 2

## 2019-04-20 MED ORDER — EPHEDRINE 5 MG/ML INJ: 10.0000 mg | INTRAVENOUS | Status: DC | PRN

## 2019-04-20 MED ORDER — ONDANSETRON HCL 4 MG/2ML IJ SOLN: 4.0000 mg | INTRAMUSCULAR | Status: DC | PRN

## 2019-04-20 MED ORDER — FENTANYL-BUPIVACAINE-NACL 0.5-0.125-0.9 MG/250ML-% EP SOLN: 12.0000 mL/h | EPIDURAL | Status: DC | PRN

## 2019-04-20 MED ORDER — DIPHENHYDRAMINE HCL 50 MG/ML IJ SOLN: 12.5000 mg | INTRAMUSCULAR | Status: DC | PRN

## 2019-04-20 MED ORDER — LACTATED RINGERS IV SOLN
500.0000 mL | Freq: Once | INTRAVENOUS | Status: AC
  Administered 2019-04-20: 500 mL via INTRAVENOUS

## 2019-04-20 MED ORDER — ALBUTEROL SULFATE (2.5 MG/3ML) 0.083% IN NEBU: 2.5000 mg | INHALATION_SOLUTION | RESPIRATORY_TRACT | Status: DC | PRN

## 2019-04-20 MED ORDER — IBUPROFEN 600 MG PO TABS
600.0000 mg | ORAL_TABLET | Freq: Four times a day (QID) | ORAL | Status: DC
  Administered 2019-04-21 – 2019-04-22 (×5): 600 mg via ORAL
  Filled 2019-04-20 (×6): qty 1

## 2019-04-20 MED ORDER — SIMETHICONE 80 MG PO CHEW: 80.0000 mg | CHEWABLE_TABLET | ORAL | Status: DC | PRN

## 2019-04-20 MED ORDER — LIDOCAINE HCL (PF) 1 % IJ SOLN
INTRAMUSCULAR | Status: DC | PRN
  Administered 2019-04-20: 6 mL via EPIDURAL

## 2019-04-20 MED ORDER — OXYTOCIN 40 UNITS IN NORMAL SALINE INFUSION - SIMPLE MED
1.0000 m[IU]/min | INTRAVENOUS | Status: DC
  Administered 2019-04-20: 4 m[IU]/min via INTRAVENOUS
  Filled 2019-04-20: qty 1000

## 2019-04-20 MED ORDER — MISOPROSTOL 200 MCG PO TABS
600.0000 ug | ORAL_TABLET | ORAL | Status: AC | PRN
  Administered 2019-04-20: 600 ug via VAGINAL
  Filled 2019-04-20: qty 3

## 2019-04-20 MED ORDER — SODIUM CHLORIDE (PF) 0.9 % IJ SOLN
INTRAMUSCULAR | Status: DC | PRN
  Administered 2019-04-20: 12 mL/h via EPIDURAL

## 2019-04-20 NOTE — Progress Notes (Signed)
CSW received consult due to IUFD. CSW available for support secondary to [facility] and will await call from Chaplain before becoming involved.  CSW screening out referral at this time.  Lear Ng, LCSW [facility] [phone]

## 2019-04-20 NOTE — Anesthesia Procedure Notes (Signed)
Epidural Patient location during procedure: OB Start time: 04/20/2019 4:02 PM End time: 04/20/2019 4:07 PM  Staffing Anesthesiologist: Bethena Midget, MD  Preanesthetic Checklist Completed: patient identified, IV checked, site marked, risks and benefits discussed, surgical consent, monitors and equipment checked, pre-op evaluation and timeout performed  Epidural Patient position: sitting Prep: DuraPrep and site prepped and draped Patient monitoring: continuous pulse ox and blood pressure Approach: midline Location: L3-L4 Injection technique: LOR air  Needle:  Needle type: Tuohy  Needle gauge: 17 G Needle length: 9 cm and 9 Needle insertion depth: 5 cm cm Catheter type: closed end flexible Catheter size: 19 Gauge Catheter at skin depth: 10 cm Test dose: negative  Assessment Events: blood not aspirated, injection not painful, no injection resistance, no paresthesia and negative IV test

## 2019-04-20 NOTE — Discharge Summary (Signed)
Postpartum Discharge Summary     Patient Name: Margaret Sanchez DOB: [birth year] MRN: [id number]  Date of admission: 04/19/2019 Delivering Provider: Laury Deep   Date of discharge: 04/22/2019  Admitting diagnosis: Intrauterine fetal death at 95 weeks or more of gestation [O36.4XX0] Intrauterine pregnancy: [redacted]w[redacted]d    Secondary diagnosis:  Active Problems:   Intrauterine fetal death at 259weeks or more of gestation    Discharge diagnosis: Preterm Pregnancy Delivered and placental abruption. IUFD at 36 weeks                                   Augmentation: AROM, Pitocin, Cytotec and Foley Balloon  Com[facility]: [facility]  Induction of Labor With Vaginal Delivery   29y.o. yo G1P0 at 355w1das admitted to the hospital 04/19/2019 for induction of labor.  Indication for induction: IUFD.  Patient had an uncomplicated labor course as follows: Membrane Rupture Time/Date: 1:13 PM ,04/20/2019   Intrapartum Procedures: Episiotomy: None [1]                                         Lacerations:  None [1]  Patient had delivery of a Non Viable infant.  Information for the patient's newborn:  McAili, Cas[place]oy FD [[id number]]Delivery Method: Vaginal, Spontaneous(Filed from Delivery Summary)    04/20/2019  Details of delivery can be found in separate delivery note.  Patient had a routine postpartum course. Patient was noted to have elevated BP on PPD#1 and started on procardia. Patient is discharged home 04/22/19. Delivery time: 6:20 PM    Magnesium Sulfate received: No BMZ received: No Rhophylac:N/A MMR:N/A Transfusion:No  Physical exam  Vitals:   04/21/19 2055 04/22/19 0000 04/22/19 0624 04/22/19 0827  BP: 127/78 (!) 149/86 131/83 125/87  Pulse: 73 71 71 66  Resp: '18 18 17 16  ' Temp:  98 F (36.7 C) 98.1 F (36.7 C) 98.3 F (36.8 C)  TempSrc: Oral Oral Oral Oral  SpO2: 100% 100% 100% 100%  Weight:      Height:       General: alert,  cooperative and no distress Lochia: appropriate Uterine Fundus: firm DVT Evaluation: No evidence of DVT seen on physical exam. Labs: Lab Results  Component Value Date   WBC 9.8 04/19/2019   HGB 13.2 04/19/2019   HCT 40.9 04/19/2019   MCV 84.0 04/19/2019   PLT 430 (H) 04/19/2019   CMP Latest Ref Rng & Units 04/19/2019  Glucose 70 - 99 mg/dL 82  BUN 6 - 20 mg/dL <5(L)  Creatinine 0.44 - 1.00 mg/dL 0.69  Sodium 135 - 145 mmol/L 140  Potassium 3.5 - 5.1 mmol/L 4.0  Chloride 98 - 111 mmol/L 107  CO2 22 - 32 mmol/L 24  Calcium 8.9 - 10.3 mg/dL 9.2  Total Protein 6.5 - 8.1 g/dL 7.5  Total Bilirubin 0.3 - 1.2 mg/dL 0.5  Alkaline Phos 38 - 126 U/L 182(H)  AST 15 - 41 U/L 21  ALT 0 - 44 U/L 18   Edinburgh Score: No flowsheet data found.  Discharge instruction: per After Visit Summary and "Baby and Me Booklet".  After visit meds:  Allergies as of 04/22/2019   No Known Allergies     Medication List    STOP taking these medications   Doxylamine-Pyridoxine  10-10 MG Tbec Commonly known as: Diclegis     TAKE these medications   albuterol 108 (90 Base) MCG/ACT inhaler Commonly known as: VENTOLIN HFA Inhale 2 puffs into the lungs every 6 (six) hours as needed for wheezing or shortness of breath.   ibuprofen 600 MG tablet Commonly known as: ADVIL Take 1 tablet (600 mg total) by mouth every 6 (six) hours.   NIFEdipine 30 MG 24 hr tablet Commonly known as: ADALAT CC Take 1 tablet (30 mg total) by mouth 2 (two) times daily.   prenatal multivitamin Tabs tablet Take 1 tablet by mouth daily at 12 noon.       Diet: routine diet  Activity: Advance as tolerated. Pelvic rest for 6 weeks.   Outpatient follow up: 1 week for BP and mood check Follow up Appt: Future Appointments  Date Time Provider [place]  05/04/2019  8:15 AM [place]  05/19/2019 10:00 AM [place]   Follow up Visit: Follow-up Information     Department, [facility]. Schedule an appointment as soon as possible for a visit in 2 week(s).   Contact information: [place] [address] [place] for [facility] Follow up.   Specialty: Obstetrics and Gynecology Why: an appointment will be made for you to have a blood pressure check in 2 weeks Contact information: [address] [place], [place] [phone] [place] [id number] [phone]            Please schedule this patient for Postpartum visit in: 2 weeks with the following provider: Any provider In-Person For C/S patients schedule nurse incision check in weeks 2 weeks: no Low risk pregnancy complicated by: IUFD @ 36 wks Delivery mode:  SVD Anticipated Birth Control:  other/unsure PP Procedures needed: Mood check  Schedule Integrated [place] visit: yes   Newborn Data: Non-living born female "Margaret Sanchez" Birth Weight:   APGAR: n/a  Newborn Delivery   Birth date/time: 04/20/2019 18:20:00 Delivery type: Vaginal, Spontaneous      Baby Feeding: n/a Disposition:morgue   04/22/2019 Mora Bellman, MD

## 2019-04-20 NOTE — Progress Notes (Signed)
Attempted to visit with Margaret Sanchez twice today, but she was sleeping.  Pt RN, Hospital doctor, and Nurse Midwife, [place] share that patient has been tearful but coping appropriately.  Chaplain spent some time with pt's grandmother.  She shared that [facility] mother was having a lot of difficulty coping with the news of the baby [name]'s death and was afraid she would not be a good support for her daughter, so her grandmother is staying with her today in hopes that her mother can come tomorrow.  Grandmother was tearful and shared some of her concerns about her decision regarding whether or not to see the baby.  I offered space to explore her hesitation and concerns and continued expression of her emotions.    Will continue to follow.  Please page as further needs arise.  Maryanna Shape. Carley Hammed, M.Div. [facility] Chaplain Pager [phone] Office [phone]

## 2019-04-20 NOTE — Anesthesia Preprocedure Evaluation (Signed)
Anesthesia Evaluation  Patient identified by MRN, date of birth, ID band Patient awake    Reviewed: Allergy & Precautions, H&P , NPO status , Patient's Chart, lab work & pertinent test results, reviewed documented beta blocker date and time   Airway Mallampati: III  TM Distance: >3 FB Neck ROM: full    Dental no notable dental hx. (+) Teeth Intact, Dental Advisory Given   Pulmonary neg pulmonary ROS,    Pulmonary exam normal breath sounds clear to auscultation       Cardiovascular negative cardio ROS Normal cardiovascular exam Rhythm:regular Rate:Normal     Neuro/Psych negative neurological ROS  negative psych ROS   GI/Hepatic negative GI ROS, Neg liver ROS,   Endo/Other  negative endocrine ROS  Renal/GU negative Renal ROS  negative genitourinary   Musculoskeletal   Abdominal   Peds  Hematology negative hematology ROS (+)   Anesthesia Other Findings   Reproductive/Obstetrics (+) Pregnancy                             Anesthesia Physical Anesthesia Plan  ASA: III  Anesthesia Plan: Epidural   Post-op Pain Management:    Induction:   PONV Risk Score and Plan:   Airway Management Planned:   Additional Equipment:   Intra-op Plan:   Post-operative Plan:   Informed Consent: I have reviewed the patients History and Physical, chart, labs and discussed the procedure including the risks, benefits and alternatives for the proposed anesthesia with the patient or authorized representative who has indicated his/her understanding and acceptance.       Plan Discussed with:   Anesthesia Plan Comments:         Anesthesia Quick Evaluation

## 2019-04-20 NOTE — Progress Notes (Signed)
Margaret Sanchez is a 29 y.o. G1P0 at [redacted]w[redacted]d by LMP admitted for induction of labor due to IUFD.  Subjective: Discussed cervical Balloon placement and Cytotec dose change with patient and how it will help advice cervical dilation and not prolong the IOL process. No questions and desires to proceed with plan as discussed.  Objective: BP (!) 141/84   Pulse 61   Temp 98.4 F (36.9 C) (Oral)   Resp 17   Ht 5\' 4"  (1.626 m)   Wt 84.4 kg   LMP 08/10/2018 Comment: only lasted 2 days  SpO2 99%   BMI 31.93 kg/m    FHT:  n/a UC:   Irregular UC's with UI noted SVE:   Dilation: 1 Effacement (%): Thick Station: -3 Exam by:: [email or web address], CNM Cervical balloon placed without difficulty. Patient tolerated well.  Labs: Lab Results  Component Value Date   WBC 9.8 04/19/2019   HGB 13.2 04/19/2019   HCT 40.9 04/19/2019   MCV 84.0 04/19/2019   PLT 430 (H) 04/19/2019    Assessment / Plan: Induction of labor due to fetal demise  Labor: Progressing slowly. Preeclampsia:  n/a Fetal Wellbeing:  n/a Pain Control:  IV pain meds I/D:  n/a Anticipated MOD:  NSVD  04/21/2019, CNM 04/20/2019, 12:21 PM

## 2019-04-20 NOTE — Progress Notes (Signed)
Diamond Nickel, the chaplain, is at the bedside consoling patient. Pt appropriately tearful with boyfriend, K'vonne at the bedside. Nursing will continue to assess.

## 2019-04-20 NOTE — Progress Notes (Signed)
Margaret Sanchez is a 29 y.o. G1P0 at [redacted]w[redacted]d by LMP admitted for induction of labor due to IUFD.  Subjective: Notified by RN that FB came out and ?SROM @ 1313.  Objective: BP 134/75   Pulse 63   Temp 99.9 F (37.7 C) (Axillary)   Resp 16   Ht 5\' 4"  (1.626 m)   Wt 84.4 kg   LMP 08/10/2018 Comment: only lasted 2 days  SpO2 99%   BMI 31.93 kg/m  No intake/output data recorded. No intake/output data recorded.  FHT:  n/a UC:   Irregular UC's with UI noted SVE:   Dilation: 5 Effacement (%): 60 Station: -3 Exam by:: [email or web address], CNM AROM forebag with copious amounts of brown fluid in return  Labs: Lab Results  Component Value Date   WBC 9.8 04/19/2019   HGB 13.2 04/19/2019   HCT 40.9 04/19/2019   MCV 84.0 04/19/2019   PLT 430 (H) 04/19/2019    Assessment / Plan: Induction of labor due to fetal demise  Labor: Progressing normally Preeclampsia:  n/a Fetal Wellbeing:  n/a Pain Control:  setup for epidural I/D:  n/a Anticipated MOD:  NSVD  04/21/2019, CNM 04/20/2019, 3:20 PM

## 2019-04-20 NOTE — Progress Notes (Signed)
Patient Vitals for the past 4 hrs:  BP Temp Temp src Pulse Resp  04/20/19 0545 127/76 98.5 F (36.9 C) Oral (!) 56 17  04/20/19 0349 139/78 99 F (37.2 C) Oral (!) 57 --   Pt sleeping, cramping more now, using IV fentanyl for pain control. Cx no change (1/thick/anterior/-2).  Cytotec placed.

## 2019-04-20 NOTE — Progress Notes (Signed)
Margaret Sanchez is a 29 y.o. G1P0 at [redacted]w[redacted]d by LMP admitted for induction of labor due to IUFD.  Subjective: She reports feeling some cramping, but denies any intolerable pain at this time. She just awakened from a nap. She received Fentanyl about 1.5 hours ago. The FOB, Margaret Sanchez, is present and supportive at her bedside. She and FOB are emotionally appropriate. They have no questions for CNM at this time.  Objective: BP 134/65   Pulse 61   Temp 98.4 F (36.9 C) (Oral)   Resp 16   Ht 5\' 4"  (1.626 m)   Wt 84.4 kg   LMP 08/10/2018 Comment: only lasted 2 days  SpO2 99%   BMI 31.93 kg/m  No intake/output data recorded. No intake/output data recorded.  FHT:  n/a UC:   Irregular UC's with UI noted SVE:   Dilation: 1 Effacement (%): Thick Station: -2 Exam by:: [email or web address] CNM  Labs: Lab Results  Component Value Date   WBC 9.8 04/19/2019   HGB 13.2 04/19/2019   HCT 40.9 04/19/2019   MCV 84.0 04/19/2019   PLT 430 (H) 04/19/2019    Assessment / Plan: Induction of labor due to fetal demise  Labor: Progressing normally Preeclampsia:  n/a Fetal Wellbeing:  n/a Pain Control:  IV pain meds I/D:  n/a Anticipated MOD:  NSVD  04/21/2019, CNM 04/20/2019, 10:22 AM

## 2019-04-21 ENCOUNTER — Other Ambulatory Visit: Payer: Self-pay | Admitting: Obstetrics and Gynecology

## 2019-04-21 ENCOUNTER — Encounter (HOSPITAL_COMMUNITY): Payer: Self-pay | Admitting: Obstetrics & Gynecology

## 2019-04-21 DIAGNOSIS — O364XX0 Maternal care for intrauterine death, not applicable or unspecified: Secondary | ICD-10-CM

## 2019-04-21 LAB — TOXOPLASMA ANTIBODIES- IGG AND  IGM
Toxoplasma Antibody- IgM: <3 AU/mL (ref 0.0–7.9)
Toxoplasma IgG Ratio: <3 IU/mL (ref 0.0–7.1)

## 2019-04-21 LAB — CMV IGM: CMV IgM: <30 AU/mL (ref 0.0–29.9)

## 2019-04-21 LAB — CMV ANTIBODY, IGG (EIA): CMV Ab - IgG: 6.3 U/mL — ABNORMAL HIGH (ref 0.00–0.59)

## 2019-04-21 MED ORDER — CYCLOBENZAPRINE HCL 10 MG PO TABS
10.0000 mg | ORAL_TABLET | Freq: Three times a day (TID) | ORAL | Status: DC | PRN
  Administered 2019-04-21: 10 mg via ORAL
  Filled 2019-04-21: qty 1

## 2019-04-21 MED ORDER — NIFEDIPINE ER OSMOTIC RELEASE 30 MG PO TB24
30.0000 mg | ORAL_TABLET | Freq: Two times a day (BID) | ORAL | Status: DC
  Administered 2019-04-21 – 2019-04-22 (×3): 30 mg via ORAL
  Filled 2019-04-21 (×3): qty 1

## 2019-04-21 NOTE — Progress Notes (Signed)
I offered support to [facility] and her family and offered prayer at their request.  I spent some additional time with pt's mother to offer support.  She and Ke'Maurye have been in counseling before after the death of Ke'Maurye's father when she was young.  She plans to go back to counseling at this time and is hopeful that [facility] will as well.  Provided resources for follow up support.  Chaplain Dyanne Carrel, Bcc Pager, [phone] 3:03 PM

## 2019-04-21 NOTE — Anesthesia Postprocedure Evaluation (Signed)
Anesthesia Post Note  Patient: Margaret Sanchez  Procedure(s) Performed: AN AD HOC LABOR EPIDURAL     Patient location during evaluation: Mother Baby Anesthesia Type: Epidural Level of consciousness: awake and alert Pain management: pain level controlled Vital Signs Assessment: post-procedure vital signs reviewed and stable Respiratory status: spontaneous breathing, nonlabored ventilation and respiratory function stable Cardiovascular status: stable Postop Assessment: no headache, no backache and epidural receding Anesthetic complications: no    Last Vitals:  Vitals:   04/21/19 0004 04/21/19 0523  BP: 131/61 129/72  Pulse:  61  Resp: 18 18  Temp:  36.7 C  SpO2: 99% 99%    Last Pain:  Vitals:   04/21/19 0528  TempSrc:   PainSc: 0-No pain                 Knowledge Escandon

## 2019-04-21 NOTE — Anesthesia Postprocedure Evaluation (Signed)
Anesthesia Post Note  Patient: Margaret Sanchez  Procedure(s) Performed: AN AD HOC LABOR EPIDURAL     Patient location during evaluation: Mother Baby Anesthesia Type: Epidural Level of consciousness: awake Pain management: satisfactory to patient Vital Signs Assessment: post-procedure vital signs reviewed and stable Respiratory status: spontaneous breathing Cardiovascular status: stable Anesthetic complications: no    Last Vitals:  Vitals:   04/21/19 0004 04/21/19 0523  BP: 131/61 129/72  Pulse:  61  Resp: 18 18  Temp:  36.7 C  SpO2: 99% 99%    Last Pain:  Vitals:   04/21/19 0528  TempSrc:   PainSc: 0-No pain   Pain Goal: Patients Stated Pain Goal: 7 (04/20/19 0829)                 Cephus Shelling

## 2019-04-21 NOTE — Progress Notes (Signed)
Post Partum Day 1 Subjective: Patient reports feeling well and is without complaints. She has ambulated and voided. She denies HA, visual changes, RUQ/epigastric pain, nausea or emesis. She denies CP, SOB, lightheadedness/dizziness  Objective: Blood pressure (!) 144/93, pulse (!) 55, temperature 97.7 F (36.5 C), temperature source Oral, resp. rate 18, height 5\' 4"  (1.626 m), weight 84.4 kg, last menstrual period 08/10/2018, SpO2 100 %.  Physical Exam:  General: alert, cooperative and no distress Lochia: appropriate Uterine Fundus: firm DVT Evaluation: No evidence of DVT seen on physical exam.  Recent Labs    04/19/19 1640  HGB 13.2  HCT 40.9    Assessment/Plan: Patient is doing well s/p SVD at 36 weeks with IUFD - Elevated BP this morning and overnight - Will start procardia - Monitor BP closely - Discharge later today or tomorrow morning   LOS: 2 days   Margaret Sanchez 04/21/2019, 9:17 AM

## 2019-04-21 NOTE — Addendum Note (Signed)
Addendum  created 04/21/19 0731 by Algis Greenhouse, CRNA   Charge Capture section accepted, Clinical Note Signed

## 2019-04-21 NOTE — Lactation Note (Signed)
Lactation Consultation Note  Patient Name: Margaret Sanchez EHUDJ'S Date: 04/21/2019 Reason for consult: Other (Comment)(Fetal Demise - see LC note) Fetal demise LC spoke with patients RN - Salena Saner and provided  her Lactation after Loss Pamphlet. LC asked the the RN caring for mom to call LC if mom has a desire to see LC prior to discharge. Raynelle Fanning mentioned she will review with mom how to dry up her milk.    Maternal Data    Feeding    LATCH Score                   Interventions    Lactation Tools Discussed/Used     Consult Status Consult Status: Complete    Matilde Sprang Leslieanne Cobarrubias 04/21/2019, 8:45 AM

## 2019-04-22 LAB — SURGICAL PATHOLOGY

## 2019-04-22 MED ORDER — NIFEDIPINE ER 30 MG PO TB24: 30.0000 mg | ORAL_TABLET | Freq: Two times a day (BID) | ORAL | 1 refills | Status: DC

## 2019-04-22 MED ORDER — IBUPROFEN 600 MG PO TABS: 600.0000 mg | ORAL_TABLET | Freq: Four times a day (QID) | ORAL | 0 refills | Status: DC

## 2019-04-22 MED FILL — NIFEdipine ER 30 MG TB24: 30 | 30 days supply | Qty: 60 | Fill #0

## 2019-04-22 MED FILL — IBUPROFEN 600 MG TABLET: 600 | 8 days supply | Qty: 30 | Fill #0

## 2019-04-22 NOTE — Progress Notes (Signed)
Discharge instructions and prescriptions given to pt. Discussed post-vaginal delivery care, signs and symptoms to report to the MD, postpartum depression, upcoming appointments, and meds. Pt verbalizes understanding and has no questions or concerns at this time. Pt discharged in stable condition.

## 2019-04-22 NOTE — Progress Notes (Signed)
I offered follow up support to pt and her SO. We talked about resources for ongoing support including from her pastor and from her family.  She is interested in doing some counseling and I let her know about [facility] grief counseling.  Provided spiritual and emotional support as they prepared to go home.  Chaplain Dyanne Carrel, Bcc Pager, [phone] 2:31 PM

## 2019-04-27 ENCOUNTER — Encounter: Payer: Medicaid Other | Admitting: Licensed Clinical Social Worker

## 2019-04-28 ENCOUNTER — Ambulatory Visit (INDEPENDENT_AMBULATORY_CARE_PROVIDER_SITE_OTHER): Payer: Medicaid Other

## 2019-04-28 ENCOUNTER — Other Ambulatory Visit: Payer: Self-pay

## 2019-04-28 VITALS — BP 140/98 | HR 85 | Wt 175.6 lb

## 2019-04-28 DIAGNOSIS — Z0130 Encounter for examination of blood pressure without abnormal findings: Secondary | ICD-10-CM

## 2019-04-28 MED ORDER — NIFEDIPINE ER OSMOTIC RELEASE 60 MG PO TB24: 60.0000 mg | ORAL_TABLET | Freq: Two times a day (BID) | ORAL | 0 refills | Status: DC

## 2019-04-28 NOTE — Progress Notes (Signed)
Pt here today for BP check s/p start of Nifedipine 30 mg po bid.  Pt denies headaches and visual disturbances.  BP LA via dynamap 142/102.  Advised pt that I would recheck in about 10 minutes.  Rechecked BP manually 140/98.  Notified Dr. Shawnie Pons who recommends that pt start taking the Nifedipine 60 mg po bid instead of 30 mg po bid.  Pt advised to continue to monitor for sx's of HTN and low blood pressure.  Pt also advised to come back in one week for BP check due to change in dosage.  Pt verbalized understanding.   Addison Naegeli, RN  04/28/19

## 2019-04-28 NOTE — Addendum Note (Signed)
Addended by: Faythe Casa on: 04/28/2019 04:44 PM   Modules accepted: Orders

## 2019-05-04 ENCOUNTER — Institutional Professional Consult (permissible substitution): Payer: Medicaid Other

## 2019-05-04 ENCOUNTER — Other Ambulatory Visit: Payer: Self-pay

## 2019-05-05 ENCOUNTER — Ambulatory Visit (INDEPENDENT_AMBULATORY_CARE_PROVIDER_SITE_OTHER): Payer: Medicaid Other | Admitting: *Deleted

## 2019-05-05 ENCOUNTER — Other Ambulatory Visit: Payer: Self-pay

## 2019-05-05 VITALS — BP 116/69 | HR 91 | Ht 64.0 in | Wt 174.4 lb

## 2019-05-05 DIAGNOSIS — Z0130 Encounter for examination of blood pressure without abnormal findings: Secondary | ICD-10-CM

## 2019-05-05 NOTE — Progress Notes (Signed)
Chart reviewed for nurse visit. Since BP was well controlled, recommended decreasing Nifedipine and checking BP again on Monday. Agree with plan of care.   Daphnie Venturini L, DO 05/05/2019 1:15 PM

## 2019-05-05 NOTE — Progress Notes (Signed)
Pt here for BP check - 116/69, P - 91. Pt denies H/A or visual disturbances. She reports having some swelling of feet and ankles - trace per assessment and informed this is wnl. Still having some vaginal bleeding which is mostly red/pink and small - moderate in amount  Pt delivered IUFD @ [redacted] wks EGA on 04/20/19. Condolences were given and emotional support offered. Pt's mood is somber. She denies suicidal or hostile intentions and states she has a good support system. Pt is already scheduled for [facility] appt on 4/13 and agrees to appt. Pt was advised to return to [facility] if she develops H/A not relieved by tylenol or rest, blurry vision, dizziness or spots in front of eyes. She voiced understanding and had no further questions or compaints. She desires to have PP appt in this office instead of [facility].   1110  Addendum: Per Dr. Salomon Mast, pt should decrease Nifedipine to 30 mg XL BID and return for BP check on 4/12.  I called pt and was unable to leave message due to VM being full.   1140  Called and spoke w/pt. I apologized for providing incorrect information regarding the scheduling of her PP appt in this office. Pt stated that she has already called [facility] to schedule the appt there and is awaiting a call back from the nurse. Pt did not seem to be upset. I informed her of recommended change in Nifedipine dose to 30 mg XL BID - begin this with evening dose today. Pt was also informed that we would like to see her back on 4/12 @ 0850 for BP check. Pt agreed to plan of care and voiced understanding of all information and instructions given.

## 2019-05-10 ENCOUNTER — Ambulatory Visit: Payer: Medicaid Other

## 2019-05-10 NOTE — BH Specialist Note (Signed)
Pt left video visit and did not answer the phone ; Voicemail is full, so unable to leave message; left MyChart message for patient.    Integrated Behavioral Health via Telemedicine Video Visit  05/10/2019 Margaret Sanchez [id number]  Rae Lips

## 2019-05-11 ENCOUNTER — Ambulatory Visit: Payer: Medicaid Other | Admitting: Clinical

## 2019-05-11 DIAGNOSIS — Z91199 Patient's noncompliance with other medical treatment and regimen due to unspecified reason: Secondary | ICD-10-CM

## 2019-05-11 DIAGNOSIS — Z5329 Procedure and treatment not carried out because of patient's decision for other reasons: Secondary | ICD-10-CM

## 2019-05-13 NOTE — BH Specialist Note (Signed)
Pt did not arrive to video visit and did not answer the phone ; Mailbox full, unable to leave voice message; left MyChart message for patient.    Integrated Behavioral Health via Telemedicine Video Visit  05/13/2019 Valerya Maxton [id number]  Rae Lips

## 2019-05-18 ENCOUNTER — Ambulatory Visit: Payer: Medicaid Other | Admitting: Clinical

## 2019-05-18 DIAGNOSIS — Z5329 Procedure and treatment not carried out because of patient's decision for other reasons: Secondary | ICD-10-CM

## 2019-05-18 DIAGNOSIS — Z91199 Patient's noncompliance with other medical treatment and regimen due to unspecified reason: Secondary | ICD-10-CM

## 2019-05-19 ENCOUNTER — Ambulatory Visit: Payer: Medicaid Other

## 2019-05-19 ENCOUNTER — Ambulatory Visit (INDEPENDENT_AMBULATORY_CARE_PROVIDER_SITE_OTHER): Payer: Medicaid Other | Admitting: *Deleted

## 2019-05-19 ENCOUNTER — Other Ambulatory Visit: Payer: Self-pay

## 2019-05-19 VITALS — BP 97/66 | HR 79 | Wt 164.3 lb

## 2019-05-19 DIAGNOSIS — Z0130 Encounter for examination of blood pressure without abnormal findings: Secondary | ICD-10-CM

## 2019-05-19 NOTE — Progress Notes (Signed)
Pt was here earlier today for BP check. She stated she is feeling well and denied all sx of pre-e. Per consult w/Dr. Salomon Mast, pt was instructed to stop taking Nifedipine. Pt voiced understanding. She also stated that she had gotten confused and missed [facility] appt w/Jamie yesterday. She desires to reschedule. Pt will schedule post partum appt @ [facility] where she received her prenatal care.

## 2019-05-20 NOTE — Progress Notes (Signed)
Chart reviewed for nurse visit. Systolic BP in 90s. Advised to discontinue nifedipine at this time. Agree with plan of care.   Quinnton Bury L, DO 05/20/2019 8:26 AM

## 2019-05-20 NOTE — BH Specialist Note (Signed)
Integrated Behavioral Health via Telemedicine Video Visit  05/20/2019 Margaret Sanchez [id number]  Number of Integrated Behavioral Health visits: 1 Session Start time: 9:45  Session End time: 10:13 Total time: 28  Referring Provider: Catalina Antigua, MD Type of Visit: Video Patient/Family location: [facility] Provider location: [facility] All persons participating in visit: Patient Margaret Sanchez and [facility] Haili Donofrio    Confirmed patient's address: Yes  Confirmed patient's phone number: Yes  Any changes to demographics: No   Confirmed patient's insurance: Yes  Any changes to patient's insurance: No   Discussed confidentiality: Yes   I connected with Margaret Sanchez a video enabled telemedicine application and verified that I am speaking with the correct person using two identifiers.     I discussed the limitations of evaluation and management by telemedicine and the availability of in person appointments.  I discussed that the purpose of this visit is to provide behavioral health care while limiting exposure to the novel coronavirus.   Discussed there is a possibility of technology failure and discussed alternative modes of communication if that failure occurs.  I discussed that engaging in this video visit, they consent to the provision of behavioral healthcare and the services will be billed under their insurance.  Patient and/or legal guardian expressed understanding and consented to video visit: Yes   PRESENTING CONCERNS: Patient and/or family reports the following symptoms/concerns: Pt states her primary concern today is grieving the loss of her son at [redacted] week gestation; is getting little sleep and poor appetite postpartum. Pt has good support from boyfriend, friends and family.  Duration of problem: One month  STRENGTHS (Protective Factors/Coping Skills): Good social support  GOALS ADDRESSED: Patient will: 1.  Demonstrate ability to: Continue healthy grieving over  loss  INTERVENTIONS: Interventions utilized:  Supportive Counseling, Sleep Hygiene and Psychoeducation and/or Health Education Standardized Assessments completed: GAD-7 and PHQ 9  ASSESSMENT: Patient currently experiencing Grief.   Patient may benefit from psychoeducation and brief therapeutic interventions regarding coping with symptoms of normal grief   PLAN: 1. Follow up with behavioral health clinician on : Two weeks 2. Behavioral recommendations:  -Begin taking Trazadone for sleep, as prescribed -Consider additional grief support (sent to After Visit Summary) 3. Referral(s): Integrated Art gallery manager (In Clinic) and [employer]:  Grief support  I discussed the assessment and treatment plan with the patient and/or parent/guardian. They were provided an opportunity to ask questions and all were answered. They agreed with the plan and demonstrated an understanding of the instructions.   They were advised to call back or seek an in-person evaluation if the symptoms worsen or if the condition fails to improve as anticipated.  Valetta Close Margaret Sanchez  Depression screen [facility] 2/9 05/21/2019  Decreased Interest 3  Down, Depressed, Hopeless 3  PHQ - 2 Score 6  Altered sleeping 3  Tired, decreased energy 3  Change in appetite 3  Feeling bad or failure about yourself  1  Trouble concentrating 1  Moving slowly or fidgety/restless 0  Suicidal thoughts 0  PHQ-9 Score 17   GAD 7 : Generalized Anxiety Score 05/21/2019  Nervous, Anxious, on Edge 2  Control/stop worrying 1  Worry too much - different things 1  Trouble relaxing 2  Restless 0  Easily annoyed or irritable 0  Afraid - awful might happen 3  Total GAD 7 Score 9

## 2019-05-21 ENCOUNTER — Ambulatory Visit (INDEPENDENT_AMBULATORY_CARE_PROVIDER_SITE_OTHER): Payer: Medicaid Other | Admitting: Clinical

## 2019-05-21 ENCOUNTER — Other Ambulatory Visit: Payer: Self-pay | Admitting: Obstetrics and Gynecology

## 2019-05-21 DIAGNOSIS — F4321 Adjustment disorder with depressed mood: Secondary | ICD-10-CM | POA: Diagnosis not present

## 2019-05-21 MED ORDER — TRAZODONE HCL 50 MG PO TABS: 50.0000 mg | ORAL_TABLET | Freq: Every day | ORAL | 1 refills | Status: DC

## 2019-05-21 NOTE — Patient Instructions (Signed)
  Hello Margaret Sanchez, These are resources that have been helpful to women and families experiencing grief:   [place] virtual bereavement support group will be facilitated by pastoral care and perinatal education.  To start, this group will meet once a month. The registration location for this support group is on [name] website > your wellbeing > classes and support groups > support groups  [facility] (Individual and group grief support)   [facility]  [id number]   Also:  www.[email or web address]  www.nationalshare.org  www.missfoundation.org  www.nilmdts.org        www.stillstandingmag.com  www.rtzhope.org

## 2019-06-04 ENCOUNTER — Telehealth (HOSPITAL_COMMUNITY): Payer: Self-pay | Admitting: Licensed Clinical Social Worker

## 2019-06-08 ENCOUNTER — Ambulatory Visit (HOSPITAL_COMMUNITY)
Admission: AD | Admit: 2019-06-08 | Discharge: 2019-06-08 | Disposition: A | Discharge status: Home / Self Care | Payer: Medicaid Other | Attending: Psychiatry | Admitting: Psychiatry

## 2019-06-08 DIAGNOSIS — F329 Major depressive disorder, single episode, unspecified: Secondary | ICD-10-CM | POA: Diagnosis not present

## 2019-06-08 DIAGNOSIS — Z634 Disappearance and death of family member: Secondary | ICD-10-CM | POA: Diagnosis not present

## 2019-06-08 DIAGNOSIS — F419 Anxiety disorder, unspecified: Secondary | ICD-10-CM | POA: Diagnosis not present

## 2019-06-08 NOTE — H&P (Signed)
Behavioral Health Medical Screening Exam  Margaret Sanchez is an 29 y.o. female patient presented to [facility] as walk in referred by OB/GYN with complaints of depression and anxiety that started after the loss of baby at [redacted] week gestation.  Patient denies suicidal/self-harm/homicidal ideation, psychosis, and paranoia.  Denies prior psychiatric history.  Finding it difficult to get over the loss of baby.   States that she lives with her boyfriend who is supportive and just started a new job yesterday.    Total Time spent with patient: 30 minutes  Psychiatric Specialty Exam: Physical Exam  Vitals reviewed. Constitutional: She is oriented to person, place, and time. She appears well-developed and well-nourished. No distress.  Respiratory: Effort normal.  Musculoskeletal:        General: Normal range of motion.     Cervical back: Normal range of motion.  Neurological: She is alert and oriented to person, place, and time.  Skin: Skin is warm and dry.  Psychiatric: Her speech is normal and behavior is normal. Judgment and thought content normal. Her mood appears anxious (Stable). Cognition and memory are normal. She exhibits a depressed mood (Stable).    Review of Systems  Psychiatric/Behavioral: Negative for agitation, behavioral problems, confusion, decreased concentration, hallucinations and suicidal ideas. Sleep disturbance: difficult sleeping throughout night. The patient is nervous/anxious.        Patient reporting lost baby at [redacted] weeks pregnant 04/20/19 and has been depressed and anxious.  States at beginning had suicidal thoughts with no intent or plan but none currently.  Seeking outpatient psychiatric services.   All other systems reviewed and are negative.   Blood pressure (!) 136/102, pulse 78, temperature 98.1 F (36.7 C), temperature source Oral, resp. rate 20, last menstrual period 08/10/2018, SpO2 100 %.There is no height or weight on file to calculate BMI.  General Appearance:  Casual  Eye Contact:  Good  Speech:  Clear and Coherent and Normal Rate  Volume:  Normal  Mood:  Anxious and Depressed  Affect:  Appropriate and Congruent  Thought Process:  Coherent, Goal Directed and Descriptions of Associations: Intact  Orientation:  Full (Time, Place, and Person)  Thought Content:  WDL and Logical  Suicidal Thoughts:  No  Homicidal Thoughts:  No  Memory:  Immediate;   Good Recent;   Good Remote;   Good  Judgement:  Intact  Insight:  Present  Psychomotor Activity:  Normal  Concentration: Concentration: Good and Attention Span: Good  Recall:  Good  Fund of Knowledge:Good  Language: Good  Akathisia:  No  Handed:  Right  AIMS (if indicated):     Assets:  Communication Skills Desire for Improvement Housing Intimacy Physical Health Social Support  Sleep:       Musculoskeletal: Strength & Muscle Tone: within normal limits Gait & Station: normal Patient leans: N/A  Blood pressure (!) 136/102, pulse 78, temperature 98.1 F (36.7 C), temperature source Oral, resp. rate 20, last menstrual period 08/10/2018, SpO2 100 %.  Recommendations:  Out patient psychiatric services.  Referral to [facility] [place] and other community services given.  Advised to follow up with [facility] tomorrow morning.    Based on my evaluation the patient does not appear to have an emergency medical condition.    Disposition:  Psychiatrically cleared No evidence of imminent risk to self or others at present.   Patient does not meet criteria for psychiatric inpatient admission. Supportive therapy provided about ongoing stressors. Discussed crisis plan, support from social network, calling  911, coming to the Emergency Department, and calling Suicide Hotline.  Shuvon Rankin, NP 06/08/2019, 7:13 PM

## 2019-06-08 NOTE — BH Assessment (Signed)
Assessment Note  Margaret Sanchez is an 29 y.o. female presenting voluntarily to [facility] for assessment. Patient was referred by her OBGYN on this date after a post partum appointment. Patient reports she had a miscarriage at 36 weeks recently and since has been struggling with grief and depression. Patient denies depressive symptoms prior to her miscarriage. She reports a history of passive SI but denies any prior attempts or self harm. Patient denies current SI/HI/AVH. Patient denies any substance use or criminal charges. Patient states she does not have any out patient mental health providers currently.  Patient is alert and oriented x 4. She is dressed appropriately. Her speech is logical, eye contact is fair, and thoughts are organized. Her mood is depressed and her affect is tearful. She has fair insight, judgement, and impulse control. She does not appear to be responding to internal stimuli or experiencing delusional thought content.   Diagnosis: F43.21 Adjustment disorder, with depressed mood  Past Medical History:  Past Medical History:  Diagnosis Date  . Asthma     Past Surgical History:  Procedure Laterality Date  . ADENOIDECTOMY    . arm surgery Right   . TONSILLECTOMY      Family History: No family history on file.  Social History:  reports that she has never smoked. She has never used smokeless tobacco. She reports previous alcohol use. She reports current drug use. Drug: Marijuana.  Additional Social History:  Alcohol / Drug Use Pain Medications: see MAR Prescriptions: see MAR Over the Counter: see MAR History of alcohol / drug use?: No history of alcohol / drug abuse  CIWA: CIWA-Ar BP: (!) 136/102 Pulse Rate: 78 COWS:    Allergies: No Known Allergies  Home Medications: (Not in a hospital admission)   OB/GYN Status:  Patient's last menstrual period was 08/10/2018.  General Assessment Data Location of Assessment: [facility] Assessment Services TTS Assessment: In  system Is this a Tele or Face-to-Face Assessment?: Face-to-Face Is this an Initial Assessment or a Re-assessment for this encounter?: Initial Assessment Patient Accompanied by:: N/A Language Other than English: No Living Arrangements: (private residence) What gender do you identify as?: Female Marital status: Long term relationship Maiden name: Slomski Pregnancy Status: No Living Arrangements: Spouse/significant other Can pt return to current living arrangement?: Yes Admission Status: Voluntary Is patient capable of signing voluntary admission?: Yes Referral Source: Self/Family/Friend Insurance type: Medicaid  Medical Screening Exam [facility] Walk-in ONLY) Medical Exam completed: Yes  Crisis Care Plan Living Arrangements: Spouse/significant other Legal Guardian: (self) Name of Psychiatrist: denies Name of Therapist: denies  Education Status Is patient currently in school?: No Is the patient employed, unemployed or receiving disability?: Employed  Risk to self with the past 6 months Suicidal Ideation: No-Not Currently/Within Last 6 Months Has patient been a risk to self within the past 6 months prior to admission? : No Suicidal Intent: No Has patient had any suicidal intent within the past 6 months prior to admission? : No Is patient at risk for suicide?: No Suicidal Plan?: No Has patient had any suicidal plan within the past 6 months prior to admission? : No Access to Means: No Specify Access to Suicidal Means: denies What has been your use of drugs/alcohol within the last 12 months?: denies Previous Attempts/Gestures: No How many times?: 0 Other Self Harm Risks: denies Triggers for Past Attempts: None known Intentional Self Injurious Behavior: None Family Suicide History: No Recent stressful life event(s): Trauma (Comment)(miscarriage at 34 weeks) Persecutory voices/beliefs?: No Depression: Yes Depression Symptoms: Despondent,  Insomnia, Tearfulness, Isolating, Fatigue,  Guilt, Loss of interest in usual pleasures, Feeling worthless/self pity, Feeling angry/irritable Substance abuse history and/or treatment for substance abuse?: No Suicide prevention information given to non-admitted patients: Not applicable  Risk to Others within the past 6 months Homicidal Ideation: No Does patient have any lifetime risk of violence toward others beyond the six months prior to admission? : No Thoughts of Harm to Others: No Current Homicidal Intent: No Current Homicidal Plan: No Access to Homicidal Means: No Identified Victim: denies History of harm to others?: No Assessment of Violence: None Noted Violent Behavior Description: denies Does patient have access to weapons?: No Criminal Charges Pending?: No Does patient have a court date: No Is patient on probation?: No  Psychosis Hallucinations: None noted Delusions: None noted  Mental Status Report Appearance/Hygiene: Unremarkable Eye Contact: Good Motor Activity: Freedom of movement Speech: Logical/coherent Level of Consciousness: Alert Mood: Depressed Affect: Depressed Anxiety Level: Minimal Thought Processes: Coherent, Relevant Judgement: Unimpaired Orientation: Person, Place, Time, Situation Obsessive Compulsive Thoughts/Behaviors: None  Cognitive Functioning Concentration: Normal Memory: Recent Intact, Remote Intact Is patient IDD: No Insight: Fair Impulse Control: Fair Appetite: Fair Have you had any weight changes? : No Change Sleep: Decreased Total Hours of Sleep: (UTA) Vegetative Symptoms: None  ADLScreening [facility] Assessment Services) Patient's cognitive ability adequate to safely complete daily activities?: Yes Patient able to express need for assistance with ADLs?: Yes Independently performs ADLs?: Yes (appropriate for developmental age)  Prior Inpatient Therapy Prior Inpatient Therapy: No  Prior Outpatient Therapy Prior Outpatient Therapy: Yes Prior Therapy Dates: 2009 Prior  Therapy Facilty/Provider(s): [facility] Reason for Treatment: grief counseling Does patient have an ACCT team?: No Does patient have Intensive In-House Services?  : No Does patient have [facility] services? : No Does patient have P4CC services?: No  ADL Screening (condition at time of admission) Patient's cognitive ability adequate to safely complete daily activities?: Yes Is the patient deaf or have difficulty hearing?: No Does the patient have difficulty seeing, even when wearing glasses/contacts?: No Does the patient have difficulty concentrating, remembering, or making decisions?: No Patient able to express need for assistance with ADLs?: Yes Does the patient have difficulty dressing or bathing?: No Independently performs ADLs?: Yes (appropriate for developmental age) Does the patient have difficulty walking or climbing stairs?: No Weakness of Legs: None Weakness of Arms/Hands: None  Home Assistive Devices/Equipment Home Assistive Devices/Equipment: None  Therapy Consults (therapy consults require a physician order) PT Evaluation Needed: No OT Evalulation Needed: No SLP Evaluation Needed: No Abuse/Neglect Assessment (Assessment to be complete while patient is alone) Abuse/Neglect Assessment Can Be Completed: Yes Physical Abuse: Denies Verbal Abuse: Denies Sexual Abuse: Denies Exploitation of patient/patient's resources: Denies Self-Neglect: Denies Values / Beliefs Cultural Requests During Hospitalization: None Spiritual Requests During Hospitalization: None Consults Spiritual Care Consult Needed: No Transition of Care Team Consult Needed: No Advance Directives (For Healthcare) Does Patient Have a Medical Advance Directive?: No Would patient like information on creating a medical advance directive?: No - Patient declined          Disposition: Per Earleen Newport, NP patient does not meet in patient criteria. Patient provided with outpatient resources. Disposition Initial  Assessment Completed for this Encounter: Yes Disposition of Patient: Discharge Patient refused recommended treatment: No Mode of transportation if patient is discharged/movement?: Car Patient referred to: Outpatient clinic referral  On Site Evaluation by:   Reviewed with Physician:    Orvis Brill 06/08/2019 9:00 PM

## 2019-08-30 ENCOUNTER — Other Ambulatory Visit: Payer: Self-pay

## 2019-08-30 ENCOUNTER — Telehealth (INDEPENDENT_AMBULATORY_CARE_PROVIDER_SITE_OTHER): Payer: Medicaid Other | Admitting: *Deleted

## 2019-08-30 DIAGNOSIS — O0990 Supervision of high risk pregnancy, unspecified, unspecified trimester: Secondary | ICD-10-CM

## 2019-08-30 NOTE — Progress Notes (Signed)
1:16pm Patient not connected virtually for her new ob intake virtual visit. I called her at her home/ mobile number and heard a message " Your call cannot be completed at this time- please try your call again later". I was unable to leave a message. Chaley Castellanos,RN  1:21 : Patient not connected virtually for her new ob intake virtual visit. I called her at her home/ mobile number and heard a message " Your call cannot be completed at this time- please try your call again later". I was unable to leave a message.I called her contact Kvonne and left a message I was trying to reach [facility] and you are listed as a contact- please let her know we are trying to reach her for an appointment- please ask her to call our office.  I will also send a MyChart message to patient. Benecio Kluger,RN

## 2019-09-01 ENCOUNTER — Encounter: Payer: Self-pay | Admitting: *Deleted

## 2019-09-01 ENCOUNTER — Other Ambulatory Visit: Payer: Self-pay

## 2019-09-01 ENCOUNTER — Telehealth (INDEPENDENT_AMBULATORY_CARE_PROVIDER_SITE_OTHER): Payer: Medicaid Other | Admitting: *Deleted

## 2019-09-01 DIAGNOSIS — Z3A00 Weeks of gestation of pregnancy not specified: Secondary | ICD-10-CM

## 2019-09-01 DIAGNOSIS — O09899 Supervision of other high risk pregnancies, unspecified trimester: Secondary | ICD-10-CM

## 2019-09-01 DIAGNOSIS — O0991 Supervision of high risk pregnancy, unspecified, first trimester: Secondary | ICD-10-CM

## 2019-09-01 DIAGNOSIS — I10 Essential (primary) hypertension: Secondary | ICD-10-CM

## 2019-09-01 DIAGNOSIS — Z8759 Personal history of other complications of pregnancy, childbirth and the puerperium: Secondary | ICD-10-CM

## 2019-09-01 DIAGNOSIS — O0990 Supervision of high risk pregnancy, unspecified, unspecified trimester: Secondary | ICD-10-CM

## 2019-09-01 DIAGNOSIS — J4520 Mild intermittent asthma, uncomplicated: Secondary | ICD-10-CM

## 2019-09-01 DIAGNOSIS — O99511 Diseases of the respiratory system complicating pregnancy, first trimester: Secondary | ICD-10-CM

## 2019-09-01 NOTE — Progress Notes (Signed)
I connected with  Dereck Leep on 09/01/19 at  2:15 PM EDT by virtually and verified that I am speaking with the correct person using two identifiers.   I discussed the limitations, risks, security and privacy concerns of performing an evaluation and management service by virtually and the availability of in person appointments. I also discussed with the patient that there may be a patient responsible charge related to this service. The patient expressed understanding and agreed to proceed.  I explained I am completing her New OB Intake today. We discussed Her EDD and that it is based on  Sure LMP  . I reviewed her allergies, meds, OB History, Medical /Surgical history, and appropriate screenings. I informed her of [facility] services.  She declines at present.   She is G 2  P0100.  I explained we will have her take her blood pressure weekly during her pregnancy.  She verified she does have a blood pressure cuff she can get from her aunt. I asked her to let us know if she is unable to get that because we prescription if needed. I explained after her first ob appointment  we will have her take her blood pressure weekly andlog her blood pressures into an app that I will send her called Babyscripts.  The  app was sent to her while on phone.      I explained she will have some visits in office and some virtually. She already has Sports coach.  I reviewed her new ob  appointment date/ time with her , our location and to wear mask,  I explained she will have a pelvic exam, ob bloodwork, hemoglobin a1C, cbg ,pap, and  genetic testing if desired,- she does want a panorama.  I explained I will scheduled an [place] at 19 weeks and she will see the appointment later in MyChart. She voices understanding.   Avanna Sowder,RN 09/01/2019  2:20 PM

## 2019-09-07 ENCOUNTER — Ambulatory Visit (INDEPENDENT_AMBULATORY_CARE_PROVIDER_SITE_OTHER): Payer: Medicaid Other | Admitting: Obstetrics and Gynecology

## 2019-09-07 ENCOUNTER — Other Ambulatory Visit: Payer: Self-pay

## 2019-09-07 ENCOUNTER — Encounter: Payer: Self-pay | Admitting: Obstetrics and Gynecology

## 2019-09-07 ENCOUNTER — Other Ambulatory Visit (HOSPITAL_COMMUNITY)
Admission: RE | Admit: 2019-09-07 | Discharge: 2019-09-07 | Disposition: A | Discharge status: Home / Self Care | Payer: Medicaid Other | Source: Ambulatory Visit | Attending: Obstetrics and Gynecology | Admitting: Obstetrics and Gynecology

## 2019-09-07 VITALS — BP 126/83 | Wt 165.5 lb

## 2019-09-07 DIAGNOSIS — O10911 Unspecified pre-existing hypertension complicating pregnancy, first trimester: Secondary | ICD-10-CM

## 2019-09-07 DIAGNOSIS — O0990 Supervision of high risk pregnancy, unspecified, unspecified trimester: Secondary | ICD-10-CM

## 2019-09-07 DIAGNOSIS — O99341 Other mental disorders complicating pregnancy, first trimester: Secondary | ICD-10-CM

## 2019-09-07 DIAGNOSIS — O10919 Unspecified pre-existing hypertension complicating pregnancy, unspecified trimester: Secondary | ICD-10-CM | POA: Diagnosis present

## 2019-09-07 DIAGNOSIS — J4520 Mild intermittent asthma, uncomplicated: Secondary | ICD-10-CM

## 2019-09-07 DIAGNOSIS — O09899 Supervision of other high risk pregnancies, unspecified trimester: Secondary | ICD-10-CM

## 2019-09-07 DIAGNOSIS — F419 Anxiety disorder, unspecified: Secondary | ICD-10-CM

## 2019-09-07 DIAGNOSIS — O219 Vomiting of pregnancy, unspecified: Secondary | ICD-10-CM

## 2019-09-07 DIAGNOSIS — O09299 Supervision of pregnancy with other poor reproductive or obstetric history, unspecified trimester: Secondary | ICD-10-CM | POA: Insufficient documentation

## 2019-09-07 DIAGNOSIS — Z8759 Personal history of other complications of pregnancy, childbirth and the puerperium: Secondary | ICD-10-CM

## 2019-09-07 DIAGNOSIS — Z6834 Body mass index (BMI) 34.0-34.9, adult: Secondary | ICD-10-CM

## 2019-09-07 DIAGNOSIS — Z3A11 11 weeks gestation of pregnancy: Secondary | ICD-10-CM

## 2019-09-07 MED ORDER — ONDANSETRON 4 MG PO TBDP: 4.0000 mg | ORAL_TABLET | Freq: Three times a day (TID) | ORAL | 0 refills | Status: DC | PRN

## 2019-09-07 MED ORDER — ASPIRIN EC 81 MG PO TBEC: 81.0000 mg | DELAYED_RELEASE_TABLET | Freq: Every day | ORAL | Status: DC

## 2019-09-07 MED ORDER — BLOOD PRESSURE KIT KIT: 1.0000 | PACK | 0 refills | Status: AC | PRN

## 2019-09-07 NOTE — Patient Instructions (Addendum)
It was a pleasure to meet you today!   It will be important to take your prenatal vitamin daily in addition to a baby aspirin to prevent preeclampsia in pregnancy.  Please return to clinic in 4 weeks or call sooner if vaginal bleeding, severe vomiting, inability to tolerate fluids by mouth or other concerns. Please call if blood pressures consistently above 140/90.  First Trimester of Pregnancy  The first trimester of pregnancy is from week 1 until the end of week 13 (months 1 through 3). During this time, your baby will begin to develop inside you. At 6-8 weeks, the eyes and face are formed, and the heartbeat can be seen on ultrasound. At the end of 12 weeks, all the baby's organs are formed. Prenatal care is all the medical care you receive before the birth of your baby. Make sure you get good prenatal care and follow all of your doctor's instructions. Follow these instructions at home: Medicines  Take over-the-counter and prescription medicines only as told by your doctor. Some medicines are safe and some medicines are not safe during pregnancy.  Take a prenatal vitamin that contains at least 600 micrograms (mcg) of folic acid.  If you have trouble pooping (constipation), take medicine that will make your stool soft (stool softener) if your doctor approves. Eating and drinking   Eat regular, healthy meals.  Your doctor will tell you the amount of weight gain that is right for you.  Avoid raw meat and uncooked cheese.  If you feel sick to your stomach (nauseous) or throw up (vomit): ? Eat 4 or 5 small meals a day instead of 3 large meals. ? Try eating a few soda crackers. ? Drink liquids between meals instead of during meals.  To prevent constipation: ? Eat foods that are high in fiber, like fresh fruits and vegetables, whole grains, and beans. ? Drink enough fluids to keep your pee (urine) clear or pale yellow. Activity  Exercise only as told by your doctor. Stop exercising if  you have cramps or pain in your lower belly (abdomen) or low back.  Do not exercise if it is too hot, too humid, or if you are in a place of great height (high altitude).  Try to avoid standing for long periods of time. Move your legs often if you must stand in one place for a long time.  Avoid heavy lifting.  Wear low-heeled shoes. Sit and stand up straight.  You can have sex unless your doctor tells you not to. Relieving pain and discomfort  Wear a good support bra if your breasts are sore.  Take warm water baths (sitz baths) to soothe pain or discomfort caused by hemorrhoids. Use hemorrhoid cream if your doctor says it is okay.  Rest with your legs raised if you have leg cramps or low back pain.  If you have puffy, bulging veins (varicose veins) in your legs: ? Wear support hose or compression stockings as told by your doctor. ? Raise (elevate) your feet for 15 minutes, 3-4 times a day. ? Limit salt in your food. Prenatal care  Schedule your prenatal visits by the twelfth week of pregnancy.  Write down your questions. Take them to your prenatal visits.  Keep all your prenatal visits as told by your doctor. This is important. Safety  Wear your seat belt at all times when driving.  Make a list of emergency phone numbers. The list should include numbers for family, friends, the hospital, and police and fire  departments. General instructions  Ask your doctor for a referral to a local prenatal class. Begin classes no later than at the start of month 6 of your pregnancy.  Ask for help if you need counseling or if you need help with nutrition. Your doctor can give you advice or tell you where to go for help.  Do not use hot tubs, steam rooms, or saunas.  Do not douche or use tampons or scented sanitary pads.  Do not cross your legs for long periods of time.  Avoid all herbs and alcohol. Avoid drugs that are not approved by your doctor.  Do not use any tobacco products,  including cigarettes, chewing tobacco, and electronic cigarettes. If you need help quitting, ask your doctor. You may get counseling or other support to help you quit.  Avoid cat litter boxes and soil used by cats. These carry germs that can cause birth defects in the baby and can cause a loss of your baby (miscarriage) or stillbirth.  Visit your dentist. At home, brush your teeth with a soft toothbrush. Be gentle when you floss. Contact a doctor if:  You are dizzy.  You have mild cramps or pressure in your lower belly.  You have a nagging pain in your belly area.  You continue to feel sick to your stomach, you throw up, or you have watery poop (diarrhea).  You have a bad smelling fluid coming from your vagina.  You have pain when you pee (urinate).  You have increased puffiness (swelling) in your face, hands, legs, or ankles. Get help right away if:  You have a fever.  You are leaking fluid from your vagina.  You have spotting or bleeding from your vagina.  You have very bad belly cramping or pain.  You gain or lose weight rapidly.  You throw up blood. It may look like coffee grounds.  You are around people who have [place] measles, fifth disease, or chickenpox.  You have a very bad headache.  You have shortness of breath.  You have any kind of trauma, such as from a fall or a car accident. Summary  The first trimester of pregnancy is from week 1 until the end of week 13 (months 1 through 3).  To take care of yourself and your unborn baby, you will need to eat healthy meals, take medicines only if your doctor tells you to do so, and do activities that are safe for you and your baby.  Keep all follow-up visits as told by your doctor. This is important as your doctor will have to ensure that your baby is healthy and growing well. This information is not intended to replace advice given to you by your health care provider. Make sure you discuss any questions you have with  your health care provider. Document Revised: 05/07/2018 Document Reviewed: 01/23/2016 Elsevier Patient Education  2020 [employer].

## 2019-09-07 NOTE — Progress Notes (Signed)
PRENATAL VISIT NOTE  Subjective:  Margaret Sanchez is a 29 y.o. G2P0100 at [redacted]w[redacted]d being seen today for ongoing prenatal care.  She is currently monitored for the following issues for this high-risk pregnancy and has Supervision of high risk pregnancy, antepartum; History of IUFD; Short interval between pregnancies affecting pregnancy, antepartum; BMI 34.0-34.9,adult; [redacted] weeks gestation of pregnancy; and Chronic hypertension complicating or reason for care during pregnancy, first trimester on their problem list.  Patient reports intermittent nausea and occasional vomiting.  Contractions: Not present. Vag. Bleeding: None.  Denies leaking of fluid.   Living with partner Bevely Palmer). No safety concerns.  Started albuterol late in last pregnancy. No recent albuterol use. Taking prenatal vitamin daily. Started on procardia at last postpartum appt. Then increased to procardia 60mg  daily, then discontinued ~6 weeks postpartum. Pt needs a BP cuff today (sent to [facility]). No tobacco or other substances. History of anxiety but no prior medications. History of chlamydia s/p treatment. No h/o HSV. Prior pap [facility]. Pt prefers to defer today and request records.  The following portions of the patient's history were reviewed and updated as appropriate: allergies, current medications, past family history, past medical history, past social history, past surgical history and problem list.   Objective:   Vitals:   09/07/19 1520  Weight: 165 lb 8 oz (75.1 kg)    Fetal Status: Fetal Heart Rate (bpm): 154         General:  Alert, oriented and cooperative. Patient is in no acute distress.  Skin: Skin is warm and dry. No rash noted.   Cardiovascular: Normal heart rate noted  Respiratory: Normal respiratory effort, no problems with respiration noted  Abdomen: Soft, gravid, appropriate for gestational age.  Pain/Pressure: Absent     Pelvic: Cervical exam deferred        Extremities: Normal range  of motion.  Edema: None  Mental Status: Normal mood and affect. Normal behavior. Normal judgment and thought content.   Assessment and Plan:  Pregnancy: G2P0100 at [redacted]w[redacted]d  1. Supervision of high risk pregnancy, [redacted] weeks GA: Pt presents with unplanned, desired pregnancy. Approximate LMP; no prior [redacted]w[redacted]d. No red flag symptoms today. +FHTs. - US OB Comp Less 14 Wks; Future - CBC/D/Plt+RPR+Rh+ABO+Rub Ab... - Genetic Screening - Culture, OB Urine - GC/Chlamydia probe amp ([place])not at [facility] MFM OB DETAIL +14 WK - US OB Comp Less 14 Wks; Future  2. Chronic hypertension in pregnancy: Started on procardia at last postpartum appt, then increased to procardia 60mg  daily and ultimately discontinued ~6 weeks postpartum. Currently well controlled without medications. BP wnl today. - instructed to start home BP monitoring today - start aspirin EC 81 MG tablet; Take 1 tablet (81 mg total) by mouth daily. Swallow whole. - need for baseline HELLP labs at f/u prenatal appt  3. Short interval between pregnancies affecting pregnancy, antepartum: Prior IUFD as noted below.  4. History of IUFD: Occurred at [redacted]w[redacted]d on 04/20/19.  5. Nausea and vomiting in pregnancy prior to [redacted] weeks gestation: Pt reports daily nausea with occasional vomiting. Request for zofran today. - ondansetron (ZOFRAN ODT) 4 MG disintegrating tablet; Take 1 tablet (4 mg total) by mouth every 8 (eight) hours as needed for nausea or vomiting.  Dispense: 20 tablet; Refill: 0  6. Anxiety in pregnancy in first trimester, antepartum: Pt reports history of anxiety without current medications. Exacerbated by IUFD on 3/32/21. Pt declined medications and BH support today. No reported safety/mood concerns today. - provided  strict return precautions for safety/mood concerns  Preterm labor symptoms and general obstetric precautions including but not limited to vaginal bleeding, contractions, leaking of fluid and fetal movement were reviewed in  detail with the patient. Please refer to After Visit Summary for other counseling recommendations.   Return in about 4 weeks (around 10/05/2019).  Future Appointments  Date Time Provider Department Center  09/14/2019  2:00 PM [facility] US1 [facility]  10/06/2019  2:15 PM Warden Fillers, MD [facility]  10/29/2019 12:45 PM [facility] NURSE [facility]  10/29/2019  1:00 PM [facility] US1 [facility]    Sheila Oats, MD OB Fellow, Faculty Practice

## 2019-09-08 ENCOUNTER — Encounter: Payer: Self-pay | Admitting: *Deleted

## 2019-09-08 LAB — CBC/D/PLT+RPR+RH+ABO+RUB AB...
Antibody Screen: NEGATIVE
Basophils Absolute: 0 10*3/uL (ref 0.0–0.2)
Basos: 1 %
EOS (ABSOLUTE): 0 10*3/uL (ref 0.0–0.4)
Eos: 0 %
HCV Ab: <0.1 s/co ratio (ref 0.0–0.9)
HIV Screen 4th Generation wRfx: NONREACTIVE
Hematocrit: 37.4 % (ref 34.0–46.6)
Hemoglobin: 12 g/dL (ref 11.1–15.9)
Hepatitis B Surface Ag: NEGATIVE
Immature Grans (Abs): 0 10*3/uL (ref 0.0–0.1)
Immature Granulocytes: 0 %
Lymphocytes Absolute: 2.7 10*3/uL (ref 0.7–3.1)
Lymphs: 33 %
MCH: 26.6 pg (ref 26.6–33.0)
MCHC: 32.1 g/dL (ref 31.5–35.7)
MCV: 83 fL (ref 79–97)
Monocytes Absolute: 0.5 10*3/uL (ref 0.1–0.9)
Monocytes: 6 %
Neutrophils Absolute: 4.8 10*3/uL (ref 1.4–7.0)
Neutrophils: 60 %
Platelets: 383 10*3/uL (ref 150–450)
RBC: 4.51 x10E6/uL (ref 3.77–5.28)
RDW: 13.5 % (ref 11.7–15.4)
RPR Ser Ql: NONREACTIVE
Rh Factor: POSITIVE
Rubella Antibodies, IGG: 15.5 index (ref >0.99)
WBC: 8.1 10*3/uL (ref 3.4–10.8)

## 2019-09-08 LAB — HCV INTERPRETATION

## 2019-09-09 LAB — CULTURE, OB URINE

## 2019-09-09 LAB — GC/CHLAMYDIA PROBE AMP (~~LOC~~) NOT AT ARMC
Chlamydia: NEGATIVE
Comment: NEGATIVE
Comment: NORMAL
Neisseria Gonorrhea: NEGATIVE

## 2019-09-09 LAB — URINE CULTURE, OB REFLEX

## 2019-09-14 ENCOUNTER — Ambulatory Visit: Admission: RE | Admit: 2019-09-14 | Payer: Medicaid Other | Source: Ambulatory Visit

## 2019-09-15 ENCOUNTER — Ambulatory Visit: Admission: RE | Admit: 2019-09-15 | Payer: Medicaid Other | Source: Ambulatory Visit

## 2019-10-01 ENCOUNTER — Telehealth: Payer: Self-pay | Admitting: Lactation Services

## 2019-10-01 NOTE — Telephone Encounter (Signed)
Attempted to call patient x 2 with results of Horizon screening being + for Silent Carrier for Alpha Thalassemia. Received message that "your call cannot be completed at this time". Will send My Chart Message.

## 2019-10-05 ENCOUNTER — Encounter: Payer: Self-pay | Admitting: *Deleted

## 2019-10-05 ENCOUNTER — Encounter: Payer: Self-pay | Admitting: General Practice

## 2019-10-06 ENCOUNTER — Encounter: Payer: Self-pay | Admitting: Obstetrics and Gynecology

## 2019-10-22 ENCOUNTER — Encounter: Payer: Medicaid Other | Admitting: Obstetrics and Gynecology

## 2019-10-22 ENCOUNTER — Encounter: Payer: Self-pay | Admitting: Obstetrics and Gynecology

## 2019-10-29 ENCOUNTER — Ambulatory Visit: Payer: Medicaid Other | Attending: Obstetrics and Gynecology

## 2019-10-29 ENCOUNTER — Ambulatory Visit: Payer: Medicaid Other

## 2022-01-28 NOTE — L&D Delivery Note (Addendum)
OB/GYN Faculty Practice Delivery Note  Margaret Sanchez is a 32 y.o. [id number] s/p SVD at [redacted]w[redacted]d. She was admitted for IOL due to h/o IUFD.   ROM: 2h 38m with clear fluid GBS Status: Positive/-- (08/29 1616) treated w 2 doses PCN Maximum Maternal Temperature: 97.34F   Labor Progress: Initial SVE: 1/thick/ballotable. She then progressed to complete.   Delivery Date/Time: 10/18/2022 2350 Delivery: Called to room and patient was complete and pushing. Infant delivered ROA. A loose leg nuchal was noted on delivery of the infant. Shoulder and body delivered in usual fashion. Infant with spontaneous cry, placed on mother's abdomen, dried and stimulated. Cord clamped x 2 after 1-minute delay, and cut by father of baby. Cord blood drawn. Placenta delivered spontaneously with gentle cord traction. Fundus firm with massage and Pitocin. Labia, perineum, vagina, and cervix inspected inspected with no lacerations.  Baby Weight: pending  Placenta: Sent to L&D Complications: None Lacerations: None EBL: 94 mL Analgesia: Epidural   Infant:  APGAR (1 MIN): 7  APGAR (5 MINS): 9

## 2022-03-18 ENCOUNTER — Ambulatory Visit (INDEPENDENT_AMBULATORY_CARE_PROVIDER_SITE_OTHER): Payer: Medicaid Other

## 2022-03-18 ENCOUNTER — Other Ambulatory Visit (HOSPITAL_COMMUNITY)
Admission: RE | Admit: 2022-03-18 | Discharge: 2022-03-18 | Disposition: A | Discharge status: Home / Self Care | Payer: Medicaid Other | Source: Ambulatory Visit | Attending: Obstetrics and Gynecology | Admitting: Obstetrics and Gynecology

## 2022-03-18 VITALS — BP 109/73 | HR 58 | Ht 64.0 in | Wt 198.9 lb

## 2022-03-18 DIAGNOSIS — O3680X0 Pregnancy with inconclusive fetal viability, not applicable or unspecified: Secondary | ICD-10-CM

## 2022-03-18 DIAGNOSIS — O0991 Supervision of high risk pregnancy, unspecified, first trimester: Secondary | ICD-10-CM

## 2022-03-18 DIAGNOSIS — Z3A08 8 weeks gestation of pregnancy: Secondary | ICD-10-CM

## 2022-03-18 DIAGNOSIS — O0990 Supervision of high risk pregnancy, unspecified, unspecified trimester: Secondary | ICD-10-CM | POA: Insufficient documentation

## 2022-03-18 MED ORDER — PRENATE PIXIE 10-0.6-0.4-200 MG PO CAPS: 1.0000 | ORAL_CAPSULE | Freq: Every day | ORAL | 11 refills | Status: DC

## 2022-03-18 NOTE — Progress Notes (Signed)
New OB Intake  I connected with Margaret Sanchez  on 03/18/22 at  9:15 AM EST by in person and verified that I am speaking with the correct person using two identifiers. Nurse is located at [facility] and pt is located at [place].  I discussed the limitations, risks, security and privacy concerns of performing an evaluation and management service by telephone and the availability of in person appointments. I also discussed with the patient that there may be a patient responsible charge related to this service. The patient expressed understanding and agreed to proceed.  I explained I am completing New OB Intake today. We discussed EDD of 10/25/22 that is based on LMP of 01/18/22. Pt is G4/P0102. I reviewed her allergies, medications, Medical/Surgical/OB history, and appropriate screenings. I informed her of [facility] services. [facility] information placed in AVS. Based on history, this is a high risk pregnancy.  Patient Active Problem List   Diagnosis Date Noted   [redacted] weeks gestation of pregnancy 09/07/2019   Chronic hypertension complicating or reason for care during pregnancy, first trimester 09/07/2019   Supervision of high risk pregnancy, antepartum 09/01/2019   History of IUFD 09/01/2019   Short interval between pregnancies affecting pregnancy, antepartum 09/01/2019   Mild intermittent asthma 12/28/2018    Concerns addressed today  Delivery Plans Plans to deliver at [facility] Patient given information for [facility] Healthy Baby website for more information about Women's and [place]. Patient is interested in water birth. Offered upcoming OB visit with CNM to discuss further.  MyChart/Babyscripts MyChart access verified. I explained pt will have some visits in office and some virtually. Babyscripts instructions given and order placed. Patient verifies receipt of registration text/e-mail. Account successfully created and app downloaded.  Blood Pressure Cuff/Weight Scale Patient still has a BP cuff  at home from previous pregnancy. Explained after first prenatal appt pt will check weekly and document in 52. Patient does not have weight scale; declines to track weight weekly in Babyscripts.  Anatomy US Explained first scheduled [place] will be around 19 weeks. Dating and viability u/s performed today. Anatomy US TBD.   Labs Discussed Johnsie Cancel genetic screening with patient. Would like both Panorama and Horizon drawn at new OB visit. Routine prenatal labs needed.  COVID Vaccine Patient has not had COVID vaccine.   Is patient a CenteringPregnancy candidate?  Not a Candidate Declined due to  Not a candidate due to  [facility] and Hx of Stillbirth If accepted,     Social Determinants of Health Food Insecurity: Patient denies food insecurity. WIC Referral: Patient is interested in referral to [facility].  Transportation: Patient denies transportation needs. Childcare: Discussed no children allowed at ultrasound appointments. Offered childcare services; patient declines childcare services at this time.  Interested in [place]? If yes, send referral.   First visit review I reviewed new OB appt with patient. I explained they will have a provider visit that includes pap smear, genetic screening, and discuss plan of care for pregnancy. Explained pt will be seen by Peggy Constant at first visit; encounter routed to appropriate provider. Explained that patient will be seen by pregnancy navigator following visit with provider.   Margaret Lei, RN 03/18/2022  9:40 AM

## 2022-03-19 LAB — CBC/D/PLT+RPR+RH+ABO+RUBIGG...
Antibody Screen: NEGATIVE
Basophils Absolute: 0 10*3/uL (ref 0.0–0.2)
Basos: 1 %
EOS (ABSOLUTE): 0 10*3/uL (ref 0.0–0.4)
Eos: 0 %
HCV Ab: NONREACTIVE
HIV Screen 4th Generation wRfx: NONREACTIVE
Hematocrit: 37.1 % (ref 34.0–46.6)
Hemoglobin: 12.3 g/dL (ref 11.1–15.9)
Hepatitis B Surface Ag: NEGATIVE
Immature Grans (Abs): 0 10*3/uL (ref 0.0–0.1)
Immature Granulocytes: 0 %
Lymphocytes Absolute: 2.7 10*3/uL (ref 0.7–3.1)
Lymphs: 40 %
MCH: 27.6 pg (ref 26.6–33.0)
MCHC: 33.2 g/dL (ref 31.5–35.7)
MCV: 83 fL (ref 79–97)
Monocytes Absolute: 0.5 10*3/uL (ref 0.1–0.9)
Monocytes: 8 %
Neutrophils Absolute: 3.4 10*3/uL (ref 1.4–7.0)
Neutrophils: 51 %
Platelets: 338 10*3/uL (ref 150–450)
RBC: 4.45 x10E6/uL (ref 3.77–5.28)
RDW: 12.8 % (ref 11.7–15.4)
RPR Ser Ql: NONREACTIVE
Rh Factor: POSITIVE
Rubella Antibodies, IGG: 1.5 index (ref >0.99)
WBC: 6.7 10*3/uL (ref 3.4–10.8)

## 2022-03-19 LAB — CERVICOVAGINAL ANCILLARY ONLY
Chlamydia: NEGATIVE
Comment: NEGATIVE
Comment: NEGATIVE
Comment: NORMAL
Neisseria Gonorrhea: NEGATIVE
Trichomonas: POSITIVE — AB

## 2022-03-19 LAB — HCV INTERPRETATION

## 2022-03-20 LAB — CULTURE, OB URINE

## 2022-03-20 LAB — URINE CULTURE, OB REFLEX

## 2022-03-27 ENCOUNTER — Other Ambulatory Visit: Payer: Self-pay | Admitting: *Deleted

## 2022-03-27 DIAGNOSIS — A599 Trichomoniasis, unspecified: Secondary | ICD-10-CM

## 2022-03-27 MED ORDER — METRONIDAZOLE 500 MG PO TABS: 500.0000 mg | ORAL_TABLET | Freq: Two times a day (BID) | ORAL | 0 refills | Status: DC

## 2022-03-27 NOTE — Progress Notes (Signed)
TC. No answer. 4th VM left urging pt to call back ASAP. Call back number included. RX Flagyl sent per protocol. Pt does not have active MyChart account. Next prenatal visit note and sticky note updated.

## 2022-04-02 ENCOUNTER — Encounter: Payer: Self-pay | Admitting: Obstetrics and Gynecology

## 2022-04-02 ENCOUNTER — Ambulatory Visit (INDEPENDENT_AMBULATORY_CARE_PROVIDER_SITE_OTHER): Payer: Medicaid Other | Admitting: Obstetrics and Gynecology

## 2022-04-02 VITALS — BP 116/77 | HR 70 | Wt 197.8 lb

## 2022-04-02 DIAGNOSIS — O0991 Supervision of high risk pregnancy, unspecified, first trimester: Secondary | ICD-10-CM | POA: Diagnosis not present

## 2022-04-02 DIAGNOSIS — O09291 Supervision of pregnancy with other poor reproductive or obstetric history, first trimester: Secondary | ICD-10-CM | POA: Diagnosis not present

## 2022-04-02 DIAGNOSIS — Z8759 Personal history of other complications of pregnancy, childbirth and the puerperium: Secondary | ICD-10-CM

## 2022-04-02 DIAGNOSIS — Z23 Encounter for immunization: Secondary | ICD-10-CM

## 2022-04-02 DIAGNOSIS — Z3A10 10 weeks gestation of pregnancy: Secondary | ICD-10-CM | POA: Diagnosis not present

## 2022-04-02 DIAGNOSIS — O09299 Supervision of pregnancy with other poor reproductive or obstetric history, unspecified trimester: Secondary | ICD-10-CM

## 2022-04-02 MED ORDER — METRONIDAZOLE 500 MG PO TABS: 500.0000 mg | ORAL_TABLET | Freq: Two times a day (BID) | ORAL | 0 refills | Status: DC

## 2022-04-02 MED ORDER — ASPIRIN 81 MG PO TBEC: 81.0000 mg | DELAYED_RELEASE_TABLET | Freq: Every day | ORAL | 2 refills | Status: DC

## 2022-04-02 MED ORDER — PROMETHAZINE HCL 25 MG PO TABS: 25.0000 mg | ORAL_TABLET | Freq: Four times a day (QID) | ORAL | 2 refills | Status: DC | PRN

## 2022-04-02 NOTE — Patient Instructions (Signed)
First Trimester of Pregnancy  The first trimester of pregnancy starts on the first day of your last menstrual period until the end of week 12. This is months 1 through 3 of pregnancy. A week after a sperm fertilizes an egg, the egg will implant into the wall of the uterus and begin to develop into a baby. By the end of 12 weeks, all the baby's organs will be formed and the baby will be 2-3 inches in size. Body changes during your first trimester Your body goes through many changes during pregnancy. The changes vary and generally return to normal after your baby is born. Physical changes You may gain or lose weight. Your breasts may begin to grow larger and become tender. The tissue that surrounds your nipples (areola) may become darker. Dark spots or blotches (chloasma or mask of pregnancy) may develop on your face. You may have changes in your hair. These can include thickening or thinning of your hair or changes in texture. Health changes You may feel nauseous, and you may vomit. You may have heartburn. You may develop headaches. You may develop constipation. Your gums may bleed and may be sensitive to brushing and flossing. Other changes You may tire easily. You may urinate more often. Your menstrual periods will stop. You may have a loss of appetite. You may develop cravings for certain kinds of food. You may have changes in your emotions from day to day. You may have more vivid and strange dreams. Follow these instructions at home: Medicines Follow your health care provider's instructions regarding medicine use. Specific medicines may be either safe or unsafe to take during pregnancy. Do not take any medicines unless told to by your health care provider. Take a prenatal vitamin that contains at least 600 micrograms (mcg) of folic acid. Eating and drinking Eat a healthy diet that includes fresh fruits and vegetables, whole grains, good sources of protein such as meat, eggs, or tofu,  and low-fat dairy products. Avoid raw meat and unpasteurized juice, milk, and cheese. These carry germs that can harm you and your baby. If you feel nauseous or you vomit: Eat 4 or 5 small meals a day instead of 3 large meals. Try eating a few soda crackers. Drink liquids between meals instead of during meals. You may need to take these actions to prevent or treat constipation: Drink enough fluid to keep your urine pale yellow. Eat foods that are high in fiber, such as beans, whole grains, and fresh fruits and vegetables. Limit foods that are high in fat and processed sugars, such as fried or sweet foods. Activity Exercise only as directed by your health care provider. Most people can continue their usual exercise routine during pregnancy. Try to exercise for 30 minutes at least 5 days a week. Stop exercising if you develop pain or cramping in the lower abdomen or lower back. Avoid exercising if it is very hot or humid or if you are at high altitude. Avoid heavy lifting. If you choose to, you may have sex unless your health care provider tells you not to. Relieving pain and discomfort Wear a good support bra to relieve breast tenderness. Rest with your legs elevated if you have leg cramps or low back pain. If you develop bulging veins (varicose veins) in your legs: Wear support hose as told by your health care provider. Elevate your feet for 15 minutes, 3-4 times a day. Limit salt in your diet. Safety Wear your seat belt at all times when   driving or riding in a car. Talk with your health care provider if someone is verbally or physically abusive to you. Talk with your health care provider if you are feeling sad or have thoughts of hurting yourself. Lifestyle Do not use hot tubs, steam rooms, or saunas. Do not douche. Do not use tampons or scented sanitary pads. Do not use herbal remedies, alcohol, illegal drugs, or medicines that are not approved by your health care provider. Chemicals  in these products can harm your baby. Do not use any products that contain nicotine or tobacco, such as cigarettes, e-cigarettes, and chewing tobacco. If you need help quitting, ask your health care provider. Avoid cat litter boxes and soil used by cats. These carry germs that can cause birth defects in the baby and possibly loss of the unborn baby (fetus) by miscarriage or stillbirth. General instructions During routine prenatal visits in the first trimester, your health care provider will do a physical exam, perform necessary tests, and ask you how things are going. Keep all follow-up visits. This is important. Ask for help if you have counseling or nutritional needs during pregnancy. Your health care provider can offer advice or refer you to specialists for help with various needs. Schedule a dentist appointment. At home, brush your teeth with a soft toothbrush. Floss gently. Write down your questions. Take them to your prenatal visits. Where to find more information American Pregnancy Association: americanpregnancy.[place] and Gynecologists: PoolDevices.com.pt Office on [employer] Health: KeywordPortfolios.com.br Contact a health care provider if you have: Dizziness. A fever. Mild pelvic cramps, pelvic pressure, or nagging pain in the abdominal area. Nausea, vomiting, or diarrhea that lasts for 24 hours or longer. A bad-smelling vaginal discharge. Pain when you urinate. Known exposure to a contagious illness, such as chickenpox, measles, Zika virus, HIV, or hepatitis. Get help right away if you have: Spotting or bleeding from your vagina. Severe abdominal cramping or pain. Shortness of breath or chest pain. Any kind of trauma, such as from a fall or a car crash. New or increased pain, swelling, or redness in an arm or leg. Summary The first trimester of pregnancy starts on the first day of your last menstrual period until the end of week  12 (months 1 through 3). Eating 4 or 5 small meals a day rather than 3 large meals may help to relieve nausea and vomiting. Do not use any products that contain nicotine or tobacco, such as cigarettes, e-cigarettes, and chewing tobacco. If you need help quitting, ask your health care provider. Keep all follow-up visits. This is important. This information is not intended to replace advice given to you by your health care provider. Make sure you discuss any questions you have with your health care provider. Document Revised: 06/23/2019 Document Reviewed: 04/29/2019 Elsevier Patient Education  [place] of Pregnancy  The second trimester of pregnancy is from week 13 through week 27. This is months 4 through 6 of pregnancy. The second trimester is often a time when you feel your best. Your body has adjusted to being pregnant, and you begin to feel better physically. During the second trimester: Morning sickness has lessened or stopped completely. You may have more energy. You may have an increase in appetite. The second trimester is also a time when the unborn baby (fetus) is growing rapidly. At the end of the sixth month, the fetus may be up to 12 inches long and weigh about 1 pounds. You will likely  begin to feel the baby move (quickening) between 16 and 20 weeks of pregnancy. Body changes during your second trimester Your body continues to go through many changes during your second trimester. The changes vary and generally return to normal after the baby is born. Physical changes Your weight will continue to increase. You will notice your lower abdomen bulging out. You may begin to get stretch marks on your hips, abdomen, and breasts. Your breasts will continue to grow and to become tender. Dark spots or blotches (chloasma or mask of pregnancy) may develop on your face. A dark line from your belly button to the pubic area (linea nigra) may appear. You may have  changes in your hair. These can include thickening of your hair, rapid growth, and changes in texture. Some people also have hair loss during or after pregnancy, or hair that feels dry or thin. Health changes You may develop headaches. You may have heartburn. You may develop constipation. You may develop hemorrhoids or swollen, bulging veins (varicose veins). Your gums may bleed and may be sensitive to brushing and flossing. You may urinate more often because the fetus is pressing on your bladder. You may have back pain. This is caused by: Weight gain. Pregnancy hormones that are relaxing the joints in your pelvis. A shift in weight and the muscles that support your balance. Follow these instructions at home: Medicines Follow your health care provider's instructions regarding medicine use. Specific medicines may be either safe or unsafe to take during pregnancy. Do not take any medicines unless approved by your health care provider. Take a prenatal vitamin that contains at least 600 micrograms (mcg) of folic acid. Eating and drinking Eat a healthy diet that includes fresh fruits and vegetables, whole grains, good sources of protein such as meat, eggs, or tofu, and low-fat dairy products. Avoid raw meat and unpasteurized juice, milk, and cheese. These carry germs that can harm you and your baby. You may need to take these actions to prevent or treat constipation: Drink enough fluid to keep your urine pale yellow. Eat foods that are high in fiber, such as beans, whole grains, and fresh fruits and vegetables. Limit foods that are high in fat and processed sugars, such as fried or sweet foods. Activity Exercise only as directed by your health care provider. Most people can continue their usual exercise routine during pregnancy. Try to exercise for 30 minutes at least 5 days a week. Stop exercising if you develop contractions in your uterus. Stop exercising if you develop pain or cramping in the  lower abdomen or lower back. Avoid exercising if it is very hot or humid or if you are at a high altitude. Avoid heavy lifting. If you choose to, you may have sex unless your health care provider tells you not to. Relieving pain and discomfort Wear a supportive bra to prevent discomfort from breast tenderness. Take warm sitz baths to soothe any pain or discomfort caused by hemorrhoids. Use hemorrhoid cream if your health care provider approves. Rest with your legs raised (elevated) if you have leg cramps or low back pain. If you develop varicose veins: Wear support hose as told by your health care provider. Elevate your feet for 15 minutes, 3-4 times a day. Limit salt in your diet. Safety Wear your seat belt at all times when driving or riding in a car. Talk with your health care provider if someone is verbally or physically abusive to you. Lifestyle Do not use hot tubs, steam rooms,  or saunas. Do not douche. Do not use tampons or scented sanitary pads. Avoid cat litter boxes and soil used by cats. These carry germs that can cause birth defects in the baby and possibly loss of the fetus by miscarriage or stillbirth. Do not use herbal remedies, alcohol, illegal drugs, or medicines that are not approved by your health care provider. Chemicals in these products can harm your baby. Do not use any products that contain nicotine or tobacco, such as cigarettes, e-cigarettes, and chewing tobacco. If you need help quitting, ask your health care provider. General instructions During a routine prenatal visit, your health care provider will do a physical exam and other tests. He or she will also discuss your overall health. Keep all follow-up visits. This is important. Ask your health care provider for a referral to a local prenatal education class. Ask for help if you have counseling or nutritional needs during pregnancy. Your health care provider can offer advice or refer you to specialists for help  with various needs. Where to find more information American Pregnancy Association: americanpregnancy.org American College of Obstetricians and Gynecologists: acog.org/en/Womens%20Health/Pregnancy Office on Women's Health: womenshealth.gov/pregnancy Contact a health care provider if you have: A headache that does not go away when you take medicine. Vision changes or you see spots in front of your eyes. Mild pelvic cramps, pelvic pressure, or nagging pain in the abdominal area. Persistent nausea, vomiting, or diarrhea. A bad-smelling vaginal discharge or foul-smelling urine. Pain when you urinate. Sudden or extreme swelling of your face, hands, ankles, feet, or legs. A fever. Get help right away if you: Have fluid leaking from your vagina. Have spotting or bleeding from your vagina. Have severe abdominal cramping or pain. Have difficulty breathing. Have chest pain. Have fainting spells. Have not felt your baby move for the time period told by your health care provider. Have new or increased pain, swelling, or redness in an arm or leg. Summary The second trimester of pregnancy is from week 13 through week 27 (months 4 through 6). Do not use herbal remedies, alcohol, illegal drugs, or medicines that are not approved by your health care provider. Chemicals in these products can harm your baby. Exercise only as directed by your health care provider. Most people can continue their usual exercise routine during pregnancy. Keep all follow-up visits. This is important. This information is not intended to replace advice given to you by your health care provider. Make sure you discuss any questions you have with your health care provider. Document Revised: 06/23/2019 Document Reviewed: 04/29/2019 Elsevier Patient Education  2023 Elsevier Inc.  

## 2022-04-02 NOTE — Progress Notes (Signed)
  Subjective:    Margaret Sanchez is a [id number] 50w4dbeing seen today for her first obstetrical visit.  Her obstetrical history is significant for obesity and history of IUFD with first pregnancy at 20 weeks . Patient also reports a history of preeclampsia with first pregnancy. Patient does intend to breast feed. Pregnancy history fully reviewed.  Patient reports no complaints.  Vitals:   04/02/22 0833  BP: 116/77  Pulse: 70  Weight: 197 lb 12.8 oz (89.7 kg)    HISTORY: OB History  Gravida Para Term Preterm AB Living  '4 3 2 1   2  '$ SAB IAB Ectopic Multiple Live Births        0 2    # Outcome Date GA Lbr Len/2nd Weight Sex Delivery Anes PTL Lv  4 Current           3 Term 02/26/21 467w0d F Vag-Spont EPI  LIV  2 Term 03/14/20 3854w4dF Vag-Spont EPI  LIV  1 Preterm 04/20/19 36w19w1d40 / 00:10 4 lb 2.8 oz (1.894 kg) M Vag-Spont EPI  FD     Birth Comments: Placental abruption, IUFD   Past Medical History:  Diagnosis Date   Asthma    Depression    History of chlamydia    Hypertension    postpartum   Past Surgical History:  Procedure Laterality Date   ADENOIDECTOMY     arm surgery Right    TONSILLECTOMY     Family History  Problem Relation Age of Onset   Hypertension Mother    Asthma Sister    Asthma Brother    Asthma Brother    Asthma Brother    Asthma Brother    Hypertension Maternal Grandmother    Hypertension Paternal Grandmother      Exam    Uterus:     System: Breast:  normal appearance, no masses or tenderness   Skin: normal coloration and turgor, no rashes    Neurologic: oriented, no focal deficits   Extremities: normal strength, tone, and muscle mass   HEENT extra ocular movement intact   Mouth/Teeth mucous membranes moist, pharynx normal without lesions and dental hygiene good   Neck supple and no masses   Cardiovascular: regular rate and rhythm   Respiratory:  appears well, vitals normal, no respiratory distress, acyanotic, normal RR   Abdomen:  soft, non-tender; bowel sounds normal; no masses,  no organomegaly   Urinary:       Assessment:    Pregnancy: [id number] Active Problem List   Diagnosis Date Noted   Supervision of high-risk pregnancy 03/18/2022   Hx of preeclampsia, prior pregnancy, currently pregnant 09/07/2019   History of IUFD 09/01/2019   Mild intermittent asthma 12/28/2018        Plan:     Initial labs drawn. Prenatal vitamins. Problem list reviewed and updated. Genetic Screening discussed : Panorama ordered.  Ultrasound discussed; fetal survey: ordered. Rx ASA provided Rx for Flagyl provided for trichomonas infection  Follow up in 4 weeks. 50% of 30 min visit spent on counseling and coordination of care.     [place]uma Pacella 04/02/2022

## 2022-04-02 NOTE — Progress Notes (Signed)
Pt presents for NOB. Has not been treated for +trich. Rx resent to pharmacy. Desires genetic screen. Flu shot given today. Pt reports last Pap 2022- normal

## 2022-04-03 ENCOUNTER — Other Ambulatory Visit: Payer: Self-pay

## 2022-04-03 DIAGNOSIS — O0991 Supervision of high risk pregnancy, unspecified, first trimester: Secondary | ICD-10-CM

## 2022-04-03 DIAGNOSIS — Z8759 Personal history of other complications of pregnancy, childbirth and the puerperium: Secondary | ICD-10-CM

## 2022-04-03 DIAGNOSIS — O09299 Supervision of pregnancy with other poor reproductive or obstetric history, unspecified trimester: Secondary | ICD-10-CM

## 2022-04-08 LAB — PANORAMA PRENATAL TEST FULL PANEL:PANORAMA TEST PLUS 5 ADDITIONAL MICRODELETIONS: FETAL FRACTION: 8.4

## 2022-04-30 ENCOUNTER — Encounter: Payer: Self-pay | Admitting: Obstetrics

## 2022-04-30 ENCOUNTER — Encounter: Payer: Self-pay | Admitting: Certified Nurse Midwife

## 2022-04-30 ENCOUNTER — Ambulatory Visit (INDEPENDENT_AMBULATORY_CARE_PROVIDER_SITE_OTHER): Payer: Medicaid Other | Admitting: Certified Nurse Midwife

## 2022-04-30 VITALS — BP 115/80 | HR 70 | Wt 199.0 lb

## 2022-04-30 DIAGNOSIS — O23592 Infection of other part of genital tract in pregnancy, second trimester: Secondary | ICD-10-CM

## 2022-04-30 DIAGNOSIS — O09299 Supervision of pregnancy with other poor reproductive or obstetric history, unspecified trimester: Secondary | ICD-10-CM

## 2022-04-30 DIAGNOSIS — A5901 Trichomonal vulvovaginitis: Secondary | ICD-10-CM

## 2022-04-30 DIAGNOSIS — R4589 Other symptoms and signs involving emotional state: Secondary | ICD-10-CM

## 2022-04-30 DIAGNOSIS — O0992 Supervision of high risk pregnancy, unspecified, second trimester: Secondary | ICD-10-CM

## 2022-04-30 DIAGNOSIS — Z3A14 14 weeks gestation of pregnancy: Secondary | ICD-10-CM

## 2022-04-30 DIAGNOSIS — O09292 Supervision of pregnancy with other poor reproductive or obstetric history, second trimester: Secondary | ICD-10-CM

## 2022-04-30 NOTE — Progress Notes (Signed)
Pt presents for ROB visit. No concerns at this time.  

## 2022-05-01 ENCOUNTER — Other Ambulatory Visit (HOSPITAL_COMMUNITY)
Admission: RE | Admit: 2022-05-01 | Discharge: 2022-05-01 | Disposition: A | Discharge status: Home / Self Care | Payer: Medicaid Other | Source: Ambulatory Visit | Attending: Certified Nurse Midwife | Admitting: Certified Nurse Midwife

## 2022-05-01 DIAGNOSIS — O23592 Infection of other part of genital tract in pregnancy, second trimester: Secondary | ICD-10-CM | POA: Insufficient documentation

## 2022-05-01 DIAGNOSIS — O0992 Supervision of high risk pregnancy, unspecified, second trimester: Secondary | ICD-10-CM | POA: Diagnosis present

## 2022-05-01 DIAGNOSIS — A5901 Trichomonal vulvovaginitis: Secondary | ICD-10-CM | POA: Insufficient documentation

## 2022-05-02 NOTE — Progress Notes (Signed)
   PRENATAL VISIT NOTE  Subjective:  Margaret Sanchez is a 32 y.o. [id number] at [redacted]w[redacted]d being seen today for ongoing prenatal care.  She is currently monitored for the following issues for this high-risk pregnancy and has History of IUFD; Mild intermittent asthma; Hx of preeclampsia, prior pregnancy, currently pregnant; and Supervision of high-risk pregnancy on their problem list.  Patient reports  feeling overwhelmed. She states she knows she is supposed to be happy for this baby, but is not. She has not spoken with her partner about her emotional state and does not know how. Patient very tearful during visit.  .  Contractions: Not present. Vag. Bleeding: None.  Movement: Present. Denies leaking of fluid.   The following portions of the patient's history were reviewed and updated as appropriate: allergies, current medications, past family history, past medical history, past social history, past surgical history and problem list.   Objective:   Vitals:   04/30/22 0934  BP: 115/80  Pulse: 70  Weight: 199 lb (90.3 kg)    Fetal Status: Fetal Heart Rate (bpm): 155   Movement: Present     General:  Alert, oriented and cooperative. Patient is in no acute distress.  Skin: Skin is warm and dry. No rash noted.   Cardiovascular: Normal heart rate noted  Respiratory: Normal respiratory effort, no problems with respiration noted  Abdomen: Soft, gravid, appropriate for gestational age.  Pain/Pressure: Absent     Pelvic: Cervical exam deferred        Extremities: Normal range of motion.  Edema: None  Mental Status: Normal mood and affect. Normal behavior. Normal judgment and thought content.   Assessment and Plan:  Pregnancy: [id number] at [redacted]w[redacted]d 1. Supervision of high risk pregnancy in second trimester - Patient feeling frequent flutters.  - Cervicovaginal ancillary only( [place])  2. Trichomonal vaginitis in pregnancy in second trimester - TOC collected today - Cervicovaginal ancillary only(  [place])  3. [redacted] weeks gestation of pregnancy - Briefly reviewed 2nd trimester expectations.   4. Hx of preeclampsia, prior pregnancy, currently pregnant - BPs stable today   5. Emotional upset - Patient has a 30 year old and a 1 year at home and states she is just too overwhelmed to feel the emotions that she is feeling and express the disconnect between this pregnancy.  - Ambulatory referral to [place]   Preterm labor symptoms and general obstetric precautions including but not limited to vaginal bleeding, contractions, leaking of fluid and fetal movement were reviewed in detail with the patient. Please refer to After Visit Summary for other counseling recommendations.   Return in about 4 weeks (around 05/28/2022) for LOB.  Future Appointments  Date Time Provider [place]  05/28/2022  1:30 PM Deloris Ping, CNM [facility] None  05/28/2022  2:00 PM Lynnea Ferrier, LCSW [facility] None  05/31/2022 10:15 AM [facility] NURSE [facility]  05/31/2022 10:30 AM [facility] US2 [facility] [place]    Aniyha Tate Isaias Sakai) Rollene Rotunda, MSN, [place] for [facility]  05/02/22 9:37 PM

## 2022-05-03 LAB — CERVICOVAGINAL ANCILLARY ONLY
Bacterial Vaginitis (gardnerella): POSITIVE — AB
Candida Glabrata: NEGATIVE
Candida Vaginitis: POSITIVE — AB
Chlamydia: NEGATIVE
Comment: NEGATIVE
Comment: NEGATIVE
Comment: NEGATIVE
Comment: NEGATIVE
Comment: NEGATIVE
Comment: NORMAL
Neisseria Gonorrhea: NEGATIVE
Trichomonas: NEGATIVE

## 2022-05-04 MED ORDER — METRONIDAZOLE 500 MG PO TABS: 500.0000 mg | ORAL_TABLET | Freq: Two times a day (BID) | ORAL | 0 refills | Status: DC

## 2022-05-04 MED ORDER — TERCONAZOLE 0.4 % VA CREA: 1.0000 | TOPICAL_CREAM | Freq: Every day | VAGINAL | 0 refills | Status: DC

## 2022-05-04 NOTE — Addendum Note (Signed)
Addended by: Carlynn Herald on: 05/04/2022 06:12 PM   Modules accepted: Orders

## 2022-05-07 NOTE — Progress Notes (Signed)
TC. No answer. Left HIPAA compliant VM with request for call back. Call back number included. Pt does not have [facility] access.

## 2022-05-28 ENCOUNTER — Institutional Professional Consult (permissible substitution): Payer: Medicaid Other | Admitting: Licensed Clinical Social Worker

## 2022-05-28 ENCOUNTER — Encounter: Payer: Self-pay | Admitting: Certified Nurse Midwife

## 2022-05-28 ENCOUNTER — Ambulatory Visit (INDEPENDENT_AMBULATORY_CARE_PROVIDER_SITE_OTHER): Payer: Medicaid Other | Admitting: Certified Nurse Midwife

## 2022-05-28 VITALS — BP 105/70 | HR 65 | Wt 203.2 lb

## 2022-05-28 DIAGNOSIS — R42 Dizziness and giddiness: Secondary | ICD-10-CM

## 2022-05-28 DIAGNOSIS — O23592 Infection of other part of genital tract in pregnancy, second trimester: Secondary | ICD-10-CM

## 2022-05-28 DIAGNOSIS — Z8759 Personal history of other complications of pregnancy, childbirth and the puerperium: Secondary | ICD-10-CM

## 2022-05-28 DIAGNOSIS — A5901 Trichomonal vulvovaginitis: Secondary | ICD-10-CM

## 2022-05-28 DIAGNOSIS — Z3492 Encounter for supervision of normal pregnancy, unspecified, second trimester: Secondary | ICD-10-CM

## 2022-05-28 DIAGNOSIS — Z3A18 18 weeks gestation of pregnancy: Secondary | ICD-10-CM

## 2022-05-28 NOTE — Progress Notes (Signed)
Pt presents for ROB visit. No other concerns.  

## 2022-05-30 LAB — AFP, SERUM, OPEN SPINA BIFIDA
AFP MoM: 1.17
AFP Value: 47.2 ng/mL
Gest. Age on Collection Date: 18 weeks
Maternal Age At EDD: 29.7 yr
OSBR Risk 1 IN: 10000
Test Results:: NEGATIVE
Weight: 203 [lb_av]

## 2022-05-31 ENCOUNTER — Ambulatory Visit: Payer: Medicaid Other | Attending: Obstetrics and Gynecology

## 2022-05-31 ENCOUNTER — Other Ambulatory Visit: Payer: Self-pay | Admitting: *Deleted

## 2022-05-31 ENCOUNTER — Ambulatory Visit: Payer: Medicaid Other | Admitting: *Deleted

## 2022-05-31 VITALS — BP 98/52 | HR 72

## 2022-05-31 DIAGNOSIS — Z362 Encounter for other antenatal screening follow-up: Secondary | ICD-10-CM

## 2022-05-31 DIAGNOSIS — Z8759 Personal history of other complications of pregnancy, childbirth and the puerperium: Secondary | ICD-10-CM | POA: Insufficient documentation

## 2022-05-31 DIAGNOSIS — J45909 Unspecified asthma, uncomplicated: Secondary | ICD-10-CM | POA: Diagnosis not present

## 2022-05-31 DIAGNOSIS — O0992 Supervision of high risk pregnancy, unspecified, second trimester: Secondary | ICD-10-CM | POA: Diagnosis present

## 2022-05-31 DIAGNOSIS — O09299 Supervision of pregnancy with other poor reproductive or obstetric history, unspecified trimester: Secondary | ICD-10-CM | POA: Insufficient documentation

## 2022-05-31 DIAGNOSIS — O0991 Supervision of high risk pregnancy, unspecified, first trimester: Secondary | ICD-10-CM | POA: Insufficient documentation

## 2022-05-31 DIAGNOSIS — O99212 Obesity complicating pregnancy, second trimester: Secondary | ICD-10-CM | POA: Diagnosis not present

## 2022-05-31 DIAGNOSIS — O09292 Supervision of pregnancy with other poor reproductive or obstetric history, second trimester: Secondary | ICD-10-CM | POA: Diagnosis not present

## 2022-05-31 DIAGNOSIS — O99512 Diseases of the respiratory system complicating pregnancy, second trimester: Secondary | ICD-10-CM

## 2022-05-31 DIAGNOSIS — Z3A19 19 weeks gestation of pregnancy: Secondary | ICD-10-CM

## 2022-05-31 DIAGNOSIS — E669 Obesity, unspecified: Secondary | ICD-10-CM

## 2022-05-31 NOTE — Progress Notes (Signed)
   PRENATAL VISIT NOTE  Subjective:  Margaret Sanchez is a 32 y.o. [id number] at [redacted]w[redacted]d being seen today for ongoing prenatal care.  She is currently monitored for the following issues for this low-risk pregnancy and has History of IUFD; Mild intermittent asthma; Hx of preeclampsia, prior pregnancy, currently pregnant; and Supervision of high-risk pregnancy on their problem list.  Patient reports  occasional lightheadedness and dizziness. She admits not eating and drinking as much as she should. Reports life stressors at home with the small children at home and currently pregnant.  .  Contractions: Not present. Vag. Bleeding: None.  Movement: Absent. Denies leaking of fluid.   The following portions of the patient's history were reviewed and updated as appropriate: allergies, current medications, past family history, past medical history, past social history, past surgical history and problem list.   Objective:   Vitals:   05/28/22 1314  BP: 105/70  Pulse: 65  Weight: 203 lb 3.2 oz (92.2 kg)    Fetal Status: Fetal Heart Rate (bpm): 160   Movement: Absent     General:  Alert, oriented and cooperative. Patient is in no acute distress.  Skin: Skin is warm and dry. No rash noted.   Cardiovascular: Normal heart rate noted  Respiratory: Normal respiratory effort, no problems with respiration noted  Abdomen: Soft, gravid, appropriate for gestational age.  Pain/Pressure: Absent     Pelvic: Cervical exam deferred        Extremities: Normal range of motion.  Edema: None  Mental Status: Normal mood and affect. Normal behavior. Normal judgment and thought content.   Assessment and Plan:  Pregnancy: [id number] at [redacted]w[redacted]d 1. Encounter for supervision of low-risk pregnancy in second trimester - Patient doing okay. She has not started to feel fetal movement yet.  - Patient is overwhelmed with this pregnancy and is currently contemplating adoptive services.  - Visit with [facility] rescheduled today.  - Reviewed  that at this gestation this can be a normal occurrence.  - AFP, Serum, Open Spina Bifida  2. Trichomonal vaginitis in pregnancy in second trimester Test of Cure on 05/01/2022  3. [redacted] weeks gestation of pregnancy - 2nd trimester expectations reviewed  - AFP, Serum, Open Spina Bifida  4. History of pre-eclampsia Blood pressure 105/70, pulse 65, weight 203 lb 3.2 oz (92.2 kg), last menstrual period 01/18/2022, unknown if currently breastfeeding. - BPs normotensive at this visit   5. Episodic lightheadedness - Patient endorses not eating and drinking well.  - Reviewed quick dietary snacks that contain protein - Also discussed that this can be a normal discomfort of pregnancy. Recommended to report any worsening signs and symptoms.    Preterm labor symptoms and general obstetric precautions including but not limited to vaginal bleeding, contractions, leaking of fluid and fetal movement were reviewed in detail with the patient. Please refer to After Visit Summary for other counseling recommendations.   Return in about 4 weeks (around 06/25/2022) for LOB.  Future Appointments  Date Time Provider Department Center  06/25/2022  1:50 PM Warden Fillers, MD [facility] None  06/25/2022  2:30 PM Gwyndolyn Saxon, LCSW [facility] None  07/08/2022  2:30 PM [facility] NURSE [facility]  07/08/2022  2:45 PM [facility] US5 [facility]  07/26/2022 11:15 AM [facility] NURSE [facility]  07/26/2022 11:30 AM [facility] US3 [facility]    Stavros Cail Danella Deis) Suzie Portela, MSN, CNM  [facility]  05/31/22 4:25 PM

## 2022-06-04 ENCOUNTER — Encounter: Payer: Self-pay | Admitting: Certified Nurse Midwife

## 2022-06-25 ENCOUNTER — Ambulatory Visit (INDEPENDENT_AMBULATORY_CARE_PROVIDER_SITE_OTHER): Payer: Medicaid Other | Admitting: Licensed Clinical Social Worker

## 2022-06-25 ENCOUNTER — Ambulatory Visit (INDEPENDENT_AMBULATORY_CARE_PROVIDER_SITE_OTHER): Payer: Medicaid Other | Admitting: Obstetrics and Gynecology

## 2022-06-25 VITALS — BP 106/69 | HR 76 | Wt 203.0 lb

## 2022-06-25 DIAGNOSIS — O09299 Supervision of pregnancy with other poor reproductive or obstetric history, unspecified trimester: Secondary | ICD-10-CM

## 2022-06-25 DIAGNOSIS — Z8759 Personal history of other complications of pregnancy, childbirth and the puerperium: Secondary | ICD-10-CM

## 2022-06-25 DIAGNOSIS — F4321 Adjustment disorder with depressed mood: Secondary | ICD-10-CM

## 2022-06-25 DIAGNOSIS — O09292 Supervision of pregnancy with other poor reproductive or obstetric history, second trimester: Secondary | ICD-10-CM

## 2022-06-25 DIAGNOSIS — O0992 Supervision of high risk pregnancy, unspecified, second trimester: Secondary | ICD-10-CM

## 2022-06-25 DIAGNOSIS — Z3A22 22 weeks gestation of pregnancy: Secondary | ICD-10-CM

## 2022-06-25 NOTE — Progress Notes (Signed)
   PRENATAL VISIT NOTE  Subjective:  Margaret Sanchez is a 32 y.o. G4P2102 at [redacted]w[redacted]d being seen today for ongoing prenatal care.  She is currently monitored for the following issues for this high-risk pregnancy and has History of IUFD; Mild intermittent asthma; Hx of preeclampsia, prior pregnancy, currently pregnant; and Supervision of high-risk pregnancy on their problem list.  Patient doing well with no acute concerns today. She reports no complaints.  Contractions: Not present. Vag. Bleeding: None.  Movement: Present. Denies leaking of fluid.   The following portions of the patient's history were reviewed and updated as appropriate: allergies, current medications, past family history, past medical history, past social history, past surgical history and problem list. Problem list updated.  Objective:   Vitals:   06/25/22 1404  BP: 106/69  Pulse: 76  Weight: 203 lb (92.1 kg)    Fetal Status: Fetal Heart Rate (bpm): 151 Fundal Height: 23 cm Movement: Present     General:  Alert, oriented and cooperative. Patient is in no acute distress.  Skin: Skin is warm and dry. No rash noted.   Cardiovascular: Normal heart rate noted  Respiratory: Normal respiratory effort, no problems with respiration noted  Abdomen: Soft, gravid, appropriate for gestational age.  Pain/Pressure: Present     Pelvic: Cervical exam deferred        Extremities: Normal range of motion.  Edema: Trace  Mental Status:  Normal mood and affect. Normal behavior. Normal judgment and thought content.   Assessment and Plan:  Pregnancy: G4P2102 at [redacted]w[redacted]d  1. [redacted] weeks gestation of pregnancy   2. Supervision of high risk pregnancy in second trimester Continue routine prenatal care Pt desires 2 hour GTT at 28 weeks  3. Hx of preeclampsia, prior pregnancy, currently pregnant No s/sx of preeclampsia, compliant with baby ASA  4. History of IUFD Monthly growth scan per MFM Weekly testing at 32 weeks  Preterm labor  symptoms and general obstetric precautions including but not limited to vaginal bleeding, contractions, leaking of fluid and fetal movement were reviewed in detail with the patient.  Please refer to After Visit Summary for other counseling recommendations.   Return in about 4 weeks (around 07/23/2022) for [facility], in person.   Mariel Aloe, MD Faculty Attending Center for [facility]

## 2022-06-26 NOTE — BH Specialist Note (Signed)
Integrated Behavioral Health Initial In-Person Visit  MRN: [id number] Name: Margaret Sanchez  Number of Integrated Behavioral Health Clinician visits: 1 Session Start time:  242pm Session End time:  330pm Total time in minutes: 47 mins in person at [facility]   Types of Service: Individual psychotherapy  Interpretor:No. Interpretor Name and Language: none   Warm Hand Off Completed.        Subjective: Margaret Sanchez is a 32 y.o. female accompanied by none Patient was referred by Dr Donavan Foil  for new ob intro. Patient reports the following symptoms/concerns: crying, depressed mood, stress Duration of problem: oer one year; Severity of problem: mild  Objective: Mood: Depressed and Affect: Appropriate Risk of harm to self or others: No plan to harm self or others  Life Context: Family and Social: lives with partner and two daughters  School/Work: looking for work  Self-Care: n/a Life Changes: new pregnancy  Patient and/or Family's Strengths/Protective Factors: Concrete supports in place (healthy food, safe environments, etc.)  Goals Addressed: Patient will: Reduce symptoms of: depression Increase knowledge and/or ability of: coping skills  Demonstrate ability to: Increase healthy adjustment to current life circumstances  Progress towards Goals: Ongoing  Interventions: Interventions utilized: Supportive Counseling and Link to [employer]  Standardized Assessments completed: PHQ 9  Patient and/or Family Response: Margaret Sanchez reports depressive symptoms and stress. According to Margaret Sanchez finances and limited family and social support contributes to depressive mood   Assessment: Patient currently experiencing adjustment with depressed mood.   Patient may benefit from integrated behavioral health.  Plan: Follow up with behavioral health clinician on : new ob visit Behavioral recommendations: contact community resoures provided, communicate need for added support,  develop smart goals  Referral(s): Community Resources:  food pantry brito  "From scale of 1-10, how likely are you to follow plan?":    Gwyndolyn Saxon, LCSW

## 2022-07-08 ENCOUNTER — Encounter: Payer: Self-pay | Admitting: *Deleted

## 2022-07-08 ENCOUNTER — Ambulatory Visit: Payer: Medicaid Other | Admitting: *Deleted

## 2022-07-08 ENCOUNTER — Ambulatory Visit: Payer: Medicaid Other | Attending: Maternal & Fetal Medicine

## 2022-07-08 VITALS — BP 105/60 | HR 70

## 2022-07-08 DIAGNOSIS — O09299 Supervision of pregnancy with other poor reproductive or obstetric history, unspecified trimester: Secondary | ICD-10-CM

## 2022-07-08 DIAGNOSIS — O99512 Diseases of the respiratory system complicating pregnancy, second trimester: Secondary | ICD-10-CM

## 2022-07-08 DIAGNOSIS — J45909 Unspecified asthma, uncomplicated: Secondary | ICD-10-CM

## 2022-07-08 DIAGNOSIS — O99212 Obesity complicating pregnancy, second trimester: Secondary | ICD-10-CM | POA: Diagnosis not present

## 2022-07-08 DIAGNOSIS — Z362 Encounter for other antenatal screening follow-up: Secondary | ICD-10-CM | POA: Diagnosis present

## 2022-07-08 DIAGNOSIS — Z3A24 24 weeks gestation of pregnancy: Secondary | ICD-10-CM

## 2022-07-08 DIAGNOSIS — Z8759 Personal history of other complications of pregnancy, childbirth and the puerperium: Secondary | ICD-10-CM | POA: Diagnosis present

## 2022-07-08 DIAGNOSIS — O09292 Supervision of pregnancy with other poor reproductive or obstetric history, second trimester: Secondary | ICD-10-CM

## 2022-07-08 DIAGNOSIS — E669 Obesity, unspecified: Secondary | ICD-10-CM

## 2022-07-09 ENCOUNTER — Other Ambulatory Visit: Payer: Self-pay | Admitting: *Deleted

## 2022-07-09 DIAGNOSIS — Z8759 Personal history of other complications of pregnancy, childbirth and the puerperium: Secondary | ICD-10-CM

## 2022-07-10 ENCOUNTER — Encounter (HOSPITAL_COMMUNITY): Payer: Self-pay | Admitting: *Deleted

## 2022-07-10 ENCOUNTER — Emergency Department (HOSPITAL_COMMUNITY)
Admission: EM | Admit: 2022-07-10 | Discharge: 2022-07-10 | Disposition: A | Discharge status: Home / Self Care | Payer: Medicaid Other | Attending: Emergency Medicine | Admitting: Emergency Medicine

## 2022-07-10 ENCOUNTER — Emergency Department (HOSPITAL_COMMUNITY): Payer: Medicaid Other

## 2022-07-10 ENCOUNTER — Other Ambulatory Visit: Payer: Self-pay

## 2022-07-10 DIAGNOSIS — Z7951 Long term (current) use of inhaled steroids: Secondary | ICD-10-CM | POA: Diagnosis not present

## 2022-07-10 DIAGNOSIS — O26892 Other specified pregnancy related conditions, second trimester: Secondary | ICD-10-CM | POA: Diagnosis present

## 2022-07-10 DIAGNOSIS — O99512 Diseases of the respiratory system complicating pregnancy, second trimester: Secondary | ICD-10-CM | POA: Diagnosis not present

## 2022-07-10 DIAGNOSIS — J069 Acute upper respiratory infection, unspecified: Secondary | ICD-10-CM | POA: Insufficient documentation

## 2022-07-10 DIAGNOSIS — Z3A24 24 weeks gestation of pregnancy: Secondary | ICD-10-CM | POA: Diagnosis not present

## 2022-07-10 DIAGNOSIS — J45909 Unspecified asthma, uncomplicated: Secondary | ICD-10-CM | POA: Insufficient documentation

## 2022-07-10 DIAGNOSIS — Z7982 Long term (current) use of aspirin: Secondary | ICD-10-CM | POA: Diagnosis not present

## 2022-07-10 LAB — TROPONIN I (HIGH SENSITIVITY): Troponin I (High Sensitivity): 3 ng/L (ref <18)

## 2022-07-10 LAB — CBC
HCT: 36.2 % (ref 36.0–46.0)
Hemoglobin: 11.5 g/dL — ABNORMAL LOW (ref 12.0–15.0)
MCH: 27.4 pg (ref 26.0–34.0)
MCHC: 31.8 g/dL (ref 30.0–36.0)
MCV: 86.2 fL (ref 80.0–100.0)
Platelets: 305 K/uL (ref 150–400)
RBC: 4.2 MIL/uL (ref 3.87–5.11)
RDW: 13.3 % (ref 11.5–15.5)
WBC: 9 K/uL (ref 4.0–10.5)
nRBC: 0 % (ref 0.0–0.2)

## 2022-07-10 LAB — BASIC METABOLIC PANEL
Anion gap: 11 (ref 5–15)
BUN: 6 mg/dL (ref 6–20)
CO2: 21 mmol/L — ABNORMAL LOW (ref 22–32)
Calcium: 8.6 mg/dL — ABNORMAL LOW (ref 8.9–10.3)
Chloride: 104 mmol/L (ref 98–111)
Creatinine, Ser: 0.58 mg/dL (ref 0.44–1.00)
GFR, Estimated: >60 mL/min (ref >60)
Glucose, Bld: 89 mg/dL (ref 70–99)
Potassium: 3.7 mmol/L (ref 3.5–5.1)
Sodium: 136 mmol/L (ref 135–145)

## 2022-07-10 NOTE — ED Triage Notes (Signed)
The pt  is c/o some sob for 2 days she is 24 weeks swelling and just saw her doctor a few days ago and everything was ok with the baby. Sl lower abd pain productive cough greenish sputum lmp [id number] second child

## 2022-07-10 NOTE — Discharge Instructions (Signed)
Follow-up with your OB/GYN for recheck.  You can take Tylenol and plain Mucinex as needed for symptomatic relief.  Return to the emergency room if you have any worsening symptoms.

## 2022-07-10 NOTE — ED Provider Notes (Signed)
[facility] EM[facility]GENCY DEPARTMENT AT [facility] Provider Note   CSN: [id number] Arrival date & time: 07/10/22  1533     History  Chief Complaint  Patient presents with   Shortness of Breath    Margaret Sanchez is a 32 y.o. female.  Patient is a 32 year old female who is [redacted] weeks pregnant who presents with cough and shortness of breath.  She is [id number].  She presents with a 2-day history of some coughing with runny nose and postnasal drip.  She has been coughing up some yellowish type sputum.  She does have some shortness of breath which she says is mostly before and after coughing spells.  Although sometimes she gets short of breath walking around.  That is been going on for the last few weeks of her pregnancy.  She has some pain in the center of her chest but only with coughing.  She describes a burning in the center of her chest with coughing.  No known fevers.  She is feeling the baby move around.  She denies any vaginal bleeding or discharge.  No abdominal pain.  She does have a history of asthma and has been wheezing occasionally.  She has been complaining of some swelling in her legs although she says has been going on for about the last month.  She has intermittent headaches which has been going on throughout the pregnancy.  She does have a prior history of preeclampsia with intrauterine fetal death with one of her prior pregnancies.        Home Medications Prior to Admission medications   Medication Sig Start Date End Date Taking? Authorizing Provider  albuterol (VENTOLIN HFA) 108 (90 Base) MCG/ACT inhaler Inhale 2 puffs into the lungs every 6 (six) hours as needed for wheezing or shortness of breath. Patient not taking: Reported on 04/02/2022    [provider]  aspirin EC 81 MG tablet Take 1 tablet (81 mg total) by mouth daily. Take after 12 weeks for prevention of preeclampsia later in pregnancy 04/02/22   Constant, Peggy, MD  Blood Pressure Monitoring (BLOOD  PRESSURE KIT) KIT 1 Device by Does not apply route as needed. Patient not taking: Reported on 06/25/2022 09/07/19   Sheila Oats, MD  metroNIDAZOLE (FLAGYL) 500 MG tablet Take 1 tablet (500 mg total) by mouth 2 (two) times daily. Patient not taking: Reported on 04/02/2022 03/27/22   Warden Fillers, MD  metroNIDAZOLE (FLAGYL) 500 MG tablet Take 1 tablet (500 mg total) by mouth 2 (two) times daily. Patient not taking: Reported on 04/30/2022 04/02/22   Constant, Peggy, MD  metroNIDAZOLE (FLAGYL) 500 MG tablet Take 1 tablet (500 mg total) by mouth 2 (two) times daily. Patient not taking: Reported on 05/28/2022 05/04/22   Carlynn Herald, CNM  Prenat-FeAsp-Meth-FA-DHA w/o A (PRENATE PIXIE) 10-0.6-0.4-200 MG CAPS Take 1 capsule by mouth daily. Patient not taking: Reported on 04/02/2022 03/18/22   Warden Fillers, MD  Prenatal Vit-Fe Fumarate-FA (PRENATAL MULTIVITAMIN) TABS tablet Take 1 tablet by mouth daily at 12 noon.    [provider]      Allergies    Patient has no known allergies.    Review of Systems   Review of Systems  Constitutional:  Negative for chills, diaphoresis, fatigue and fever.  HENT:  Positive for congestion and rhinorrhea. Negative for sneezing.   Eyes: Negative.   Respiratory:  Positive for cough and shortness of breath. Negative for chest tightness.   Cardiovascular:  Positive for chest  pain and leg swelling.  Gastrointestinal:  Negative for abdominal pain, blood in stool, diarrhea, nausea and vomiting.  Genitourinary:  Negative for difficulty urinating, flank pain, frequency and hematuria.  Musculoskeletal:  Negative for arthralgias and back pain.  Skin:  Negative for rash.  Neurological:  Positive for headaches. Negative for dizziness, speech difficulty, weakness and numbness.    Physical Exam Updated Vital Signs BP 110/73   Pulse 69   Temp 98 F (36.7 C) (Oral)   Resp 17   Ht 5\' 4"  (1.626 m)   Wt 93 kg   LMP 01/18/2022   SpO2 100%   BMI 35.19 kg/m   Physical Exam Constitutional:      Appearance: She is well-developed.  HENT:     Head: Normocephalic and atraumatic.  Eyes:     Pupils: Pupils are equal, round, and reactive to light.  Cardiovascular:     Rate and Rhythm: Normal rate and regular rhythm.     Heart sounds: Normal heart sounds.  Pulmonary:     Effort: Pulmonary effort is normal. No respiratory distress.     Breath sounds: Normal breath sounds. No wheezing or rales.  Chest:     Chest wall: No tenderness.  Abdominal:     General: Bowel sounds are normal.     Palpations: Abdomen is soft.     Tenderness: There is no abdominal tenderness. There is no guarding or rebound.  Musculoskeletal:        General: Normal range of motion.     Cervical back: Normal range of motion and neck supple.     Comments: No edema or calf tenderness  Lymphadenopathy:     Cervical: No cervical adenopathy.  Skin:    General: Skin is warm and dry.     Findings: No rash.  Neurological:     Mental Status: She is alert and oriented to person, place, and time.     ED Results / Procedures / Treatments   Labs (all labs ordered are listed, but only abnormal results are displayed) Labs Reviewed  BASIC METABOLIC PANEL - Abnormal; Notable for the following components:      Result Value   CO2 21 (*)    Calcium 8.6 (*)    All other components within normal limits  CBC - Abnormal; Notable for the following components:   Hemoglobin 11.5 (*)    All other components within normal limits  TROPONIN I (HIGH SENSITIVITY)    EKG None  Radiology DG Chest 2 View  Result Date: 07/10/2022 CLINICAL DATA:  Or [redacted] weeks pregnant with shortness of breath for 2 days and productive cough. EXAM: CHEST - 2 VIEW COMPARISON:  None Available. FINDINGS: The heart size and mediastinal contours are within normal limits. Both lungs are clear. The visualized skeletal structures are unremarkable. IMPRESSION: No active cardiopulmonary disease. Electronically Signed   By:  Aram Candela M.D.   On: 07/10/2022 16:34    Procedures Procedures    Medications Ordered in ED Medications - No data to display  ED Course/ Medical Decision Making/ A&P                             Medical Decision Making Amount and/or Complexity of Data Reviewed Labs: ordered. Radiology: ordered.   Patient is a 32 year old female who is [redacted] weeks pregnant.  She presents with cough and some congestion what sounds like a viral URI.  Her lungs are clear without wheezing  or increased work of breathing.  Her chest x-ray which was interpreted by me and confirmed by the radiologist shows no evidence of pneumonia or pulmonary edema.  Her labs which were ordered in triage are reassuring.  She does not have symptoms that sound more cardiac in nature.  I do not appreciate any pedal edema on exam.  Her blood pressure is 110/73.  No clinical concerns for preeclampsia.  Fetal heart tones are 155.  I spoke with the faculty practice attending who is comfortable with patient being discharged.  They do not need to see the patient.  She was encouraged to follow-up with her OB/GYN.  She was advised that she can use her albuterol inhaler at home if she needs to for wheezing.  She was discharged home in good condition for return precautions were given.  Final Clinical Impression(s) / ED Diagnoses Final diagnoses:  Viral upper respiratory tract infection    Rx / DC Orders ED Discharge Orders     None         Rolan Bucco, MD 07/10/22 1900

## 2022-07-16 ENCOUNTER — Encounter: Payer: Medicaid Other | Admitting: Obstetrics and Gynecology

## 2022-07-23 ENCOUNTER — Encounter: Payer: Medicaid Other | Admitting: Obstetrics & Gynecology

## 2022-07-26 ENCOUNTER — Ambulatory Visit: Payer: Medicaid Other

## 2022-08-08 ENCOUNTER — Ambulatory Visit: Payer: Medicaid Other | Admitting: *Deleted

## 2022-08-08 ENCOUNTER — Ambulatory Visit: Payer: Medicaid Other | Attending: Obstetrics

## 2022-08-08 ENCOUNTER — Ambulatory Visit (INDEPENDENT_AMBULATORY_CARE_PROVIDER_SITE_OTHER): Payer: Medicaid Other | Admitting: Obstetrics and Gynecology

## 2022-08-08 VITALS — BP 101/67 | HR 77 | Wt 199.0 lb

## 2022-08-08 VITALS — BP 118/57 | HR 85

## 2022-08-08 DIAGNOSIS — O0990 Supervision of high risk pregnancy, unspecified, unspecified trimester: Secondary | ICD-10-CM

## 2022-08-08 DIAGNOSIS — E669 Obesity, unspecified: Secondary | ICD-10-CM

## 2022-08-08 DIAGNOSIS — O09299 Supervision of pregnancy with other poor reproductive or obstetric history, unspecified trimester: Secondary | ICD-10-CM | POA: Insufficient documentation

## 2022-08-08 DIAGNOSIS — O09293 Supervision of pregnancy with other poor reproductive or obstetric history, third trimester: Secondary | ICD-10-CM | POA: Diagnosis not present

## 2022-08-08 DIAGNOSIS — Z8759 Personal history of other complications of pregnancy, childbirth and the puerperium: Secondary | ICD-10-CM | POA: Diagnosis present

## 2022-08-08 DIAGNOSIS — O0993 Supervision of high risk pregnancy, unspecified, third trimester: Secondary | ICD-10-CM

## 2022-08-08 DIAGNOSIS — Z3A28 28 weeks gestation of pregnancy: Secondary | ICD-10-CM

## 2022-08-08 DIAGNOSIS — J45909 Unspecified asthma, uncomplicated: Secondary | ICD-10-CM

## 2022-08-08 DIAGNOSIS — O99513 Diseases of the respiratory system complicating pregnancy, third trimester: Secondary | ICD-10-CM

## 2022-08-08 DIAGNOSIS — O99213 Obesity complicating pregnancy, third trimester: Secondary | ICD-10-CM | POA: Diagnosis not present

## 2022-08-08 NOTE — Progress Notes (Signed)
   PRENATAL VISIT NOTE  Subjective:  Margaret Sanchez is a 32 y.o. G4P2102 at [redacted]w[redacted]d being seen today for ongoing prenatal care.  She is currently monitored for the following issues for this high-risk pregnancy and has History of IUFD; Mild intermittent asthma; Hx of preeclampsia, prior pregnancy, currently pregnant; and Supervision of high-risk pregnancy on their problem list.  Patient doing well with no acute concerns today. She reports no complaints.  Contractions: Not present. Vag. Bleeding: None.  Movement: Present. Denies leaking of fluid.   The following portions of the patient's history were reviewed and updated as appropriate: allergies, current medications, past family history, past medical history, past social history, past surgical history and problem list. Problem list updated.  Objective:   Vitals:   08/08/22 1121  BP: 101/67  Pulse: 77  Weight: 199 lb (90.3 kg)    Fetal Status: Fetal Heart Rate (bpm): 136 Fundal Height: 29 cm Movement: Present     General:  Alert, oriented and cooperative. Patient is in no acute distress.  Skin: Skin is warm and dry. No rash noted.   Cardiovascular: Normal heart rate noted  Respiratory: Normal respiratory effort, no problems with respiration noted  Abdomen: Soft, gravid, appropriate for gestational age.  Pain/Pressure: Absent     Pelvic: Cervical exam deferred        Extremities: Normal range of motion.  Edema: Trace  Mental Status:  Normal mood and affect. Normal behavior. Normal judgment and thought content.   Assessment and Plan:  Pregnancy: G4P2102 at [redacted]w[redacted]d  1. [redacted] weeks gestation of pregnancy   2. Supervision of high risk pregnancy, antepartum Continue routine prenatal care  3. Hx of preeclampsia, prior pregnancy, currently pregnant Pt notes compliance with baby ASA  4. History of IUFD U/s today, awaiting MFM recommendations for delivery timing  Preterm labor symptoms and general obstetric precautions including but not  limited to vaginal bleeding, contractions, leaking of fluid and fetal movement were reviewed in detail with the patient.  Please refer to After Visit Summary for other counseling recommendations.   Return in about 2 weeks (around 08/22/2022) for in person, ROB.   Mariel Aloe, MD Faculty Attending Center for [facility]

## 2022-08-08 NOTE — Progress Notes (Signed)
ROB, needs GTT labs

## 2022-08-09 ENCOUNTER — Other Ambulatory Visit: Payer: Self-pay | Admitting: *Deleted

## 2022-08-09 ENCOUNTER — Other Ambulatory Visit: Payer: Medicaid Other

## 2022-08-09 DIAGNOSIS — Z8759 Personal history of other complications of pregnancy, childbirth and the puerperium: Secondary | ICD-10-CM

## 2022-08-09 DIAGNOSIS — O0990 Supervision of high risk pregnancy, unspecified, unspecified trimester: Secondary | ICD-10-CM

## 2022-08-10 LAB — CBC
Hematocrit: 31.8 % — ABNORMAL LOW (ref 34.0–46.6)
Hemoglobin: 10.4 g/dL — ABNORMAL LOW (ref 11.1–15.9)
MCH: 27.1 pg (ref 26.6–33.0)
MCHC: 32.7 g/dL (ref 31.5–35.7)
MCV: 83 fL (ref 79–97)
Platelets: 303 10*3/uL (ref 150–450)
RBC: 3.84 x10E6/uL (ref 3.77–5.28)
RDW: 13.8 % (ref 11.7–15.4)
WBC: 7.1 10*3/uL (ref 3.4–10.8)

## 2022-08-10 LAB — RPR: RPR Ser Ql: NONREACTIVE

## 2022-08-10 LAB — HIV ANTIBODY (ROUTINE TESTING W REFLEX): HIV Screen 4th Generation wRfx: NONREACTIVE

## 2022-08-14 ENCOUNTER — Telehealth: Payer: Self-pay

## 2022-08-14 ENCOUNTER — Other Ambulatory Visit: Payer: Self-pay | Admitting: Obstetrics and Gynecology

## 2022-08-14 MED ORDER — ONDANSETRON 4 MG PO TBDP: 4.0000 mg | ORAL_TABLET | Freq: Four times a day (QID) | ORAL | 0 refills | Status: DC | PRN

## 2022-08-14 NOTE — Telephone Encounter (Signed)
Called pt to advise Zofran was sent in for her to take before next GTT.

## 2022-08-14 NOTE — Progress Notes (Signed)
Zofran sent for pt to take prior to 2h GTT

## 2022-08-16 ENCOUNTER — Other Ambulatory Visit: Payer: Self-pay | Admitting: Obstetrics and Gynecology

## 2022-08-16 ENCOUNTER — Other Ambulatory Visit: Payer: Medicaid Other

## 2022-08-16 DIAGNOSIS — Z3483 Encounter for supervision of other normal pregnancy, third trimester: Secondary | ICD-10-CM

## 2022-08-16 DIAGNOSIS — Z3A28 28 weeks gestation of pregnancy: Secondary | ICD-10-CM

## 2022-08-17 LAB — GLUCOSE TOLERANCE, 2 HOURS W/ 1HR
Glucose, 1 hour: 75 mg/dL (ref 70–179)
Glucose, 2 hour: 74 mg/dL (ref 70–152)
Glucose, Fasting: 74 mg/dL (ref 70–91)

## 2022-08-22 ENCOUNTER — Encounter: Payer: Medicaid Other | Admitting: Obstetrics and Gynecology

## 2022-08-24 ENCOUNTER — Inpatient Hospital Stay (HOSPITAL_COMMUNITY)
Admission: AD | Admit: 2022-08-24 | Discharge: 2022-08-25 | Disposition: A | Discharge status: Home / Self Care | Payer: Medicaid Other | Attending: Family Medicine | Admitting: Family Medicine

## 2022-08-24 ENCOUNTER — Encounter (HOSPITAL_COMMUNITY): Payer: Self-pay | Admitting: Family Medicine

## 2022-08-24 DIAGNOSIS — O98819 Other maternal infectious and parasitic diseases complicating pregnancy, unspecified trimester: Secondary | ICD-10-CM

## 2022-08-24 DIAGNOSIS — Z3A31 31 weeks gestation of pregnancy: Secondary | ICD-10-CM | POA: Diagnosis not present

## 2022-08-24 DIAGNOSIS — O26893 Other specified pregnancy related conditions, third trimester: Secondary | ICD-10-CM | POA: Insufficient documentation

## 2022-08-24 DIAGNOSIS — Z7982 Long term (current) use of aspirin: Secondary | ICD-10-CM | POA: Diagnosis not present

## 2022-08-24 DIAGNOSIS — R1031 Right lower quadrant pain: Secondary | ICD-10-CM | POA: Diagnosis not present

## 2022-08-24 DIAGNOSIS — Z3689 Encounter for other specified antenatal screening: Secondary | ICD-10-CM

## 2022-08-24 DIAGNOSIS — B9689 Other specified bacterial agents as the cause of diseases classified elsewhere: Secondary | ICD-10-CM

## 2022-08-24 DIAGNOSIS — O10913 Unspecified pre-existing hypertension complicating pregnancy, third trimester: Secondary | ICD-10-CM | POA: Insufficient documentation

## 2022-08-24 DIAGNOSIS — O99013 Anemia complicating pregnancy, third trimester: Secondary | ICD-10-CM | POA: Insufficient documentation

## 2022-08-24 LAB — CBC WITH DIFFERENTIAL/PLATELET
Abs Immature Granulocytes: 0.02 10*3/uL (ref 0.00–0.07)
Basophils Absolute: 0 10*3/uL (ref 0.0–0.1)
Basophils Relative: 0 %
Eosinophils Absolute: 0 10*3/uL (ref 0.0–0.5)
Eosinophils Relative: 0 %
HCT: 30 % — ABNORMAL LOW (ref 36.0–46.0)
Hemoglobin: 9.7 g/dL — ABNORMAL LOW (ref 12.0–15.0)
Immature Granulocytes: 0 %
Lymphocytes Relative: 40 %
Lymphs Abs: 3 10*3/uL (ref 0.7–4.0)
MCH: 27.5 pg (ref 26.0–34.0)
MCHC: 32.3 g/dL (ref 30.0–36.0)
MCV: 85 fL (ref 80.0–100.0)
Monocytes Absolute: 0.6 10*3/uL (ref 0.1–1.0)
Monocytes Relative: 8 %
Neutro Abs: 3.9 10*3/uL (ref 1.7–7.7)
Neutrophils Relative %: 52 %
Platelets: 308 10*3/uL (ref 150–400)
RBC: 3.53 MIL/uL — ABNORMAL LOW (ref 3.87–5.11)
RDW: 14 % (ref 11.5–15.5)
WBC: 7.6 10*3/uL (ref 4.0–10.5)
nRBC: 0 % (ref 0.0–0.2)

## 2022-08-24 LAB — WET PREP, GENITAL
Sperm: NONE SEEN
Trich, Wet Prep: NONE SEEN
WBC, Wet Prep HPF POC: <10 (ref <10)

## 2022-08-24 NOTE — MAU Provider Note (Incomplete)
History     CSN: [id number]  Arrival date and time: 08/24/22 2158   Event Date/Time   First Provider Initiated Contact with Patient 08/24/22 2227      Chief Complaint  Patient presents with  . Abdominal Pain  . Loss of Consciousness   Margaret Sanchez is a 32 y.o. [id number] at [redacted]w[redacted]d who receives care at [facility].  She presents today for abdominal pain.  She reports she started experiencing a sharp pain around 6pm.  She states the pain is intermittent and is improved with laying on her side and worsened with standing.  She rates the pain a 7/10 when it occurs.    Patient endorses fetal movement. Patient denies vaginal concerns including, bleeding, leaking, and discharge.   Patient reports appendix in place and denies history of fibroids or cysts.   Last ate/drank at 1700: Salad and chicken   OB History     Gravida  4   Para  3   Term  2   Preterm  1   AB      Living  2      SAB      IAB      Ectopic      Multiple  0   Live Births  2           Past Medical History:  Diagnosis Date  . Asthma   . Depression   . History of chlamydia   . Hypertension    postpartum    Past Surgical History:  Procedure Laterality Date  . ADENOIDECTOMY    . arm surgery Right   . TONSILLECTOMY      Family History  Problem Relation Age of Onset  . Hypertension Mother   . Asthma Sister   . Asthma Brother   . Asthma Brother   . Asthma Brother   . Asthma Brother   . Hypertension Maternal Grandmother   . Hypertension Paternal Grandmother     Social History   Tobacco Use  . Smoking status: Never  . Smokeless tobacco: Never  Vaping Use  . Vaping status: Never Used  Substance Use Topics  . Alcohol use: Not Currently    Comment: occasionally, prior to pregnancy  . Drug use: Not Currently    Types: Marijuana    Comment: not since confirmed pregnancy    Allergies: No Known Allergies  Medications Prior to Admission  Medication Sig Dispense Refill Last  Dose  . aspirin EC 81 MG tablet Take 1 tablet (81 mg total) by mouth daily. Take after 12 weeks for prevention of preeclampsia later in pregnancy 300 tablet 2 08/23/2022  . ondansetron (ZOFRAN-ODT) 4 MG disintegrating tablet Take 1 tablet (4 mg total) by mouth every 6 (six) hours as needed for nausea. 10 tablet 0 Past Week  . Prenatal Vit-Fe Fumarate-FA (PRENATAL MULTIVITAMIN) TABS tablet Take 1 tablet by mouth daily at 12 noon.   08/24/2022  . albuterol (VENTOLIN HFA) 108 (90 Base) MCG/ACT inhaler Inhale 2 puffs into the lungs every 6 (six) hours as needed for wheezing or shortness of breath. (Patient not taking: Reported on 04/02/2022)     . Blood Pressure Monitoring (BLOOD PRESSURE KIT) KIT 1 Device by Does not apply route as needed. (Patient not taking: Reported on 06/25/2022) 1 kit 0   . metroNIDAZOLE (FLAGYL) 500 MG tablet Take 1 tablet (500 mg total) by mouth 2 (two) times daily. (Patient not taking: Reported on 04/02/2022) 14 tablet 0   . metroNIDAZOLE (FLAGYL) 500  MG tablet Take 1 tablet (500 mg total) by mouth 2 (two) times daily. (Patient not taking: Reported on 04/30/2022) 14 tablet 0   . metroNIDAZOLE (FLAGYL) 500 MG tablet Take 1 tablet (500 mg total) by mouth 2 (two) times daily. (Patient not taking: Reported on 05/28/2022) 14 tablet 0   . Prenat-FeAsp-Meth-FA-DHA w/o A (PRENATE PIXIE) 10-0.6-0.4-200 MG CAPS Take 1 capsule by mouth daily. (Patient not taking: Reported on 04/02/2022) 30 capsule 11     Review of Systems  Constitutional:  Negative for chills and fever.  Eyes:  Positive for visual disturbance (Blurry vision prior to pain onset).  Respiratory:  Negative for cough and shortness of breath.   Gastrointestinal:  Positive for abdominal pain (RLQ). Negative for constipation, diarrhea, nausea and vomiting.  Genitourinary:  Negative for difficulty urinating, dysuria, vaginal bleeding and vaginal discharge.  Neurological:  Positive for dizziness (Prior to onset of pain.). Negative for  light-headedness and headaches.   Physical Exam   Blood pressure 107/63, pulse 69, temperature 98.5 F (36.9 C), temperature source Oral, resp. rate 20, last menstrual period 01/18/2022, SpO2 100%, unknown if currently breastfeeding.  Physical Exam Vitals reviewed.  Constitutional:      General: She is not in acute distress.    Appearance: Normal appearance. She is well-developed.  HENT:     Head: Normocephalic and atraumatic.  Eyes:     Conjunctiva/sclera: Conjunctivae normal.  Cardiovascular:     Rate and Rhythm: Normal rate.  Pulmonary:     Effort: Pulmonary effort is normal. No respiratory distress.  Abdominal:     Palpations: Abdomen is soft.     Tenderness: There is no abdominal tenderness. There is no guarding or rebound.     Comments: Gravid, Appears AGA  Genitourinary:    General: Normal vulva.     Comments: Small amt thin white ?curdy discharge noted at introitus.  Wet prep collected blindly Cervix checked and closed at internal os. Musculoskeletal:     Cervical back: Normal range of motion.  Skin:    General: Skin is warm and dry.  Neurological:     Mental Status: She is alert and oriented to person, place, and time.  Psychiatric:        Mood and Affect: Mood normal.        Behavior: Behavior normal.     Fetal Assessment *** bpm, Mod Var, -Decels, +Accels Toco: No ctx graphed  MAU Course  No results found for this or any previous visit (from the past 24 hour(s)). No results found.  MDM PE Labs: EFM  Assessment and Plan  32 year old [id number]  SIUP at 31.1 weeks Cat I FT RLQ Pain   -POC Reviewed -Exam findings discussed. -Labs ordered. -Patient offered and declines pain medication.  -  Cherre Robins MSN, CNM 08/24/2022, 10:27 PM

## 2022-08-24 NOTE — MAU Note (Signed)
Margaret Sanchez is a 32 y.o. at [redacted]w[redacted]d here in MAU reporting: by EMS. Pt reports that yesterday she had sharp RLQ pain that is intermittent all day. Today around 1800 pt states she has had constant RLQ pain. Pt states today at work she felt lightheaded and thew up and then she woke up with EMS there. Per EMS report pt coworkers state she did not fall they helped lower her to the ground. Pt denies VB or discharge. +FM. Pt denies LOF   Onset of complaint: 1800 Pain score: 7/10 RLQ  Vitals:   08/24/22 2214  BP: 107/63  Pulse: 70  Resp: 20  Temp: 98.5 F (36.9 C)  SpO2: 100%     FHT:148 Lab orders placed from triage:

## 2022-08-24 NOTE — MAU Provider Note (Signed)
History     CSN: [id number]  Arrival date and time: 08/24/22 2158   Event Date/Time   First Provider Initiated Contact with Patient 08/24/22 2227      Chief Complaint  Patient presents with   Abdominal Pain   Loss of Consciousness   Margaret Sanchez is a 32 y.o. [id number] at [redacted]w[redacted]d who receives care at [facility].  She presents today for abdominal pain.  She reports she started experiencing a sharp pain around 6pm.  She states the pain is intermittent and is improved with laying on her side and worsened with standing.  She rates the pain a 7/10 when it occurs.    Patient endorses fetal movement. Patient denies vaginal concerns including, bleeding, leaking, and discharge.   Patient reports appendix in place and denies history of fibroids or cysts.   Last ate/drank at 1700: Salad and chicken   OB History     Gravida  4   Para  3   Term  2   Preterm  1   AB      Living  2      SAB      IAB      Ectopic      Multiple  0   Live Births  2           Past Medical History:  Diagnosis Date   Asthma    Depression    History of chlamydia    Hypertension    postpartum    Past Surgical History:  Procedure Laterality Date   ADENOIDECTOMY     arm surgery Right    TONSILLECTOMY      Family History  Problem Relation Age of Onset   Hypertension Mother    Asthma Sister    Asthma Brother    Asthma Brother    Asthma Brother    Asthma Brother    Hypertension Maternal Grandmother    Hypertension Paternal Grandmother     Social History   Tobacco Use   Smoking status: Never   Smokeless tobacco: Never  Vaping Use   Vaping status: Never Used  Substance Use Topics   Alcohol use: Not Currently    Comment: occasionally, prior to pregnancy   Drug use: Not Currently    Types: Marijuana    Comment: not since confirmed pregnancy    Allergies: No Known Allergies  Medications Prior to Admission  Medication Sig Dispense Refill Last Dose   aspirin EC 81 MG  tablet Take 1 tablet (81 mg total) by mouth daily. Take after 12 weeks for prevention of preeclampsia later in pregnancy 300 tablet 2 08/23/2022   ondansetron (ZOFRAN-ODT) 4 MG disintegrating tablet Take 1 tablet (4 mg total) by mouth every 6 (six) hours as needed for nausea. 10 tablet 0 Past Week   Prenatal Vit-Fe Fumarate-FA (PRENATAL MULTIVITAMIN) TABS tablet Take 1 tablet by mouth daily at 12 noon.   08/24/2022   albuterol (VENTOLIN HFA) 108 (90 Base) MCG/ACT inhaler Inhale 2 puffs into the lungs every 6 (six) hours as needed for wheezing or shortness of breath. (Patient not taking: Reported on 04/02/2022)      Blood Pressure Monitoring (BLOOD PRESSURE KIT) KIT 1 Device by Does not apply route as needed. (Patient not taking: Reported on 06/25/2022) 1 kit 0    metroNIDAZOLE (FLAGYL) 500 MG tablet Take 1 tablet (500 mg total) by mouth 2 (two) times daily. (Patient not taking: Reported on 04/02/2022) 14 tablet 0    metroNIDAZOLE (FLAGYL) 500  MG tablet Take 1 tablet (500 mg total) by mouth 2 (two) times daily. (Patient not taking: Reported on 04/30/2022) 14 tablet 0    metroNIDAZOLE (FLAGYL) 500 MG tablet Take 1 tablet (500 mg total) by mouth 2 (two) times daily. (Patient not taking: Reported on 05/28/2022) 14 tablet 0    Prenat-FeAsp-Meth-FA-DHA w/o A (PRENATE PIXIE) 10-0.6-0.4-200 MG CAPS Take 1 capsule by mouth daily. (Patient not taking: Reported on 04/02/2022) 30 capsule 11     Review of Systems  Constitutional:  Negative for chills and fever.  Eyes:  Positive for visual disturbance (Blurry vision prior to pain onset).  Respiratory:  Negative for cough and shortness of breath.   Gastrointestinal:  Positive for abdominal pain (RLQ). Negative for constipation, diarrhea, nausea and vomiting.  Genitourinary:  Negative for difficulty urinating, dysuria, vaginal bleeding and vaginal discharge.  Neurological:  Positive for dizziness (Prior to onset of pain.). Negative for light-headedness and headaches.    Physical Exam   Blood pressure 107/63, pulse 69, temperature 98.5 F (36.9 C), temperature source Oral, resp. rate 20, last menstrual period 01/18/2022, SpO2 100%, unknown if currently breastfeeding.  Physical Exam Vitals reviewed.  Constitutional:      General: She is not in acute distress.    Appearance: Normal appearance. She is well-developed.  HENT:     Head: Normocephalic and atraumatic.  Eyes:     Conjunctiva/sclera: Conjunctivae normal.  Cardiovascular:     Rate and Rhythm: Normal rate.  Pulmonary:     Effort: Pulmonary effort is normal. No respiratory distress.  Abdominal:     Palpations: Abdomen is soft.     Tenderness: There is no abdominal tenderness. There is no guarding or rebound.     Comments: Gravid, Appears AGA  Genitourinary:    General: Normal vulva.     Comments: Small amt thin white ?curdy discharge noted at introitus.  Wet prep collected blindly Cervix checked and closed at internal os. Musculoskeletal:     Cervical back: Normal range of motion.  Skin:    General: Skin is warm and dry.  Neurological:     Mental Status: She is alert and oriented to person, place, and time.  Psychiatric:        Mood and Affect: Mood normal.        Behavior: Behavior normal.     Fetal Assessment 140 bpm, Mod Var, -Decels, +Accels Toco: No ctx graphed  MAU Course   Results for orders placed or performed during the hospital encounter of 08/24/22 (from the past 24 hour(s))  Wet prep, genital     Status: Abnormal   Collection Time: 08/24/22 10:44 PM  Result Value Ref Range   Yeast Wet Prep HPF POC PRESENT (A) NONE SEEN   Trich, Wet Prep NONE SEEN NONE SEEN   Clue Cells Wet Prep HPF POC PRESENT (A) NONE SEEN   WBC, Wet Prep HPF POC <10 <10   Sperm NONE SEEN   CBC with Differential/Platelet     Status: Abnormal   Collection Time: 08/24/22 11:34 PM  Result Value Ref Range   WBC 7.6 4.0 - 10.5 K/uL   RBC 3.53 (L) 3.87 - 5.11 MIL/uL   Hemoglobin 9.7 (L) 12.0 -  15.0 g/dL   HCT 30.8 (L) 65.7 - 84.6 %   MCV 85.0 80.0 - 100.0 fL   MCH 27.5 26.0 - 34.0 pg   MCHC 32.3 30.0 - 36.0 g/dL   RDW 96.2 95.2 - 84.1 %   Platelets 308 150 -  400 K/uL   nRBC 0.0 0.0 - 0.2 %   Neutrophils Relative % 52 %   Neutro Abs 3.9 1.7 - 7.7 K/uL   Lymphocytes Relative 40 %   Lymphs Abs 3.0 0.7 - 4.0 K/uL   Monocytes Relative 8 %   Monocytes Absolute 0.6 0.1 - 1.0 K/uL   Eosinophils Relative 0 %   Eosinophils Absolute 0.0 0.0 - 0.5 K/uL   Basophils Relative 0 %   Basophils Absolute 0.0 0.0 - 0.1 K/uL   Immature Granulocytes 0 %   Abs Immature Granulocytes 0.02 0.00 - 0.07 K/uL  Comprehensive metabolic panel     Status: Abnormal   Collection Time: 08/24/22 11:34 PM  Result Value Ref Range   Sodium 139 135 - 145 mmol/L   Potassium 3.9 3.5 - 5.1 mmol/L   Chloride 103 98 - 111 mmol/L   CO2 22 22 - 32 mmol/L   Glucose, Bld 79 70 - 99 mg/dL   BUN 7 6 - 20 mg/dL   Creatinine, Ser 5.40 0.44 - 1.00 mg/dL   Calcium 8.5 (L) 8.9 - 10.3 mg/dL   Total Protein 5.9 (L) 6.5 - 8.1 g/dL   Albumin 2.6 (L) 3.5 - 5.0 g/dL   AST 14 (L) 15 - 41 U/L   ALT 11 0 - 44 U/L   Alkaline Phosphatase 101 38 - 126 U/L   Total Bilirubin 0.8 0.3 - 1.2 mg/dL   GFR, Estimated >98 >11 mL/min   Anion gap 14 5 - 15   No results found.  MDM PE Labs: CBC/D, CMP EFM Cultures: Wet Prep Assessment and Plan  32 year old [id number]  SIUP at 31.1 weeks Cat I FT RLQ Pain   -POC Reviewed -Exam findings discussed. -Labs ordered. -Patient offered and declines pain medication.  -NST Reactive; okay to discontinue monitoring.  -Await results and reassess.   Cherre Robins MSN, CNM 08/24/2022, 10:27 PM   Reassessment (12:34 AM) -Results as above. -Provider to bedside to discuss. -Patient informed of infections noted. -Reviewed treatment. RX sent to pharmacy on file.  -Patient reports one incident of RLQ pain while waiting that lasted ~ 5 minutes, but has since subsided. -Instructed to continue  to monitor.  -Precautions reviewed. -Keep next appt as scheduled.  -Encouraged to call primary office or return to MAU if symptoms worsen or with the onset of new symptoms. -Discharged to home in stable condition.  Cherre Robins MSN, CNM Advanced Practice Provider, [facility] [employer] [facility]

## 2022-08-25 DIAGNOSIS — O26893 Other specified pregnancy related conditions, third trimester: Secondary | ICD-10-CM

## 2022-08-25 DIAGNOSIS — O99013 Anemia complicating pregnancy, third trimester: Secondary | ICD-10-CM | POA: Insufficient documentation

## 2022-08-25 DIAGNOSIS — R1031 Right lower quadrant pain: Secondary | ICD-10-CM

## 2022-08-25 DIAGNOSIS — Z3A31 31 weeks gestation of pregnancy: Secondary | ICD-10-CM

## 2022-08-25 LAB — COMPREHENSIVE METABOLIC PANEL WITH GFR
ALT: 11 U/L (ref 0–44)
AST: 14 U/L — ABNORMAL LOW (ref 15–41)
Albumin: 2.6 g/dL — ABNORMAL LOW (ref 3.5–5.0)
Alkaline Phosphatase: 101 U/L (ref 38–126)
Anion gap: 14 (ref 5–15)
BUN: 7 mg/dL (ref 6–20)
CO2: 22 mmol/L (ref 22–32)
Calcium: 8.5 mg/dL — ABNORMAL LOW (ref 8.9–10.3)
Chloride: 103 mmol/L (ref 98–111)
Creatinine, Ser: 0.65 mg/dL (ref 0.44–1.00)
GFR, Estimated: >60 mL/min (ref >60)
Glucose, Bld: 79 mg/dL (ref 70–99)
Potassium: 3.9 mmol/L (ref 3.5–5.1)
Sodium: 139 mmol/L (ref 135–145)
Total Bilirubin: 0.8 mg/dL (ref 0.3–1.2)
Total Protein: 5.9 g/dL — ABNORMAL LOW (ref 6.5–8.1)

## 2022-08-25 MED ORDER — FERROUS SULFATE 325 (65 FE) MG PO TBEC: 325.0000 mg | DELAYED_RELEASE_TABLET | ORAL | 1 refills | Status: AC

## 2022-08-25 MED ORDER — METRONIDAZOLE 500 MG PO TABS: 500.0000 mg | ORAL_TABLET | Freq: Two times a day (BID) | ORAL | 0 refills | Status: DC

## 2022-08-25 MED ORDER — TERCONAZOLE 0.8 % VA CREA: 1.0000 | TOPICAL_CREAM | Freq: Every day | VAGINAL | 0 refills | Status: DC

## 2022-08-26 ENCOUNTER — Ambulatory Visit (INDEPENDENT_AMBULATORY_CARE_PROVIDER_SITE_OTHER): Payer: Medicaid Other | Admitting: Obstetrics and Gynecology

## 2022-08-26 VITALS — BP 99/67 | HR 67 | Wt 202.0 lb

## 2022-08-26 DIAGNOSIS — O09299 Supervision of pregnancy with other poor reproductive or obstetric history, unspecified trimester: Secondary | ICD-10-CM

## 2022-08-26 DIAGNOSIS — Z3A31 31 weeks gestation of pregnancy: Secondary | ICD-10-CM

## 2022-08-26 DIAGNOSIS — Z23 Encounter for immunization: Secondary | ICD-10-CM

## 2022-08-26 DIAGNOSIS — O99013 Anemia complicating pregnancy, third trimester: Secondary | ICD-10-CM

## 2022-08-26 DIAGNOSIS — O0993 Supervision of high risk pregnancy, unspecified, third trimester: Secondary | ICD-10-CM

## 2022-08-26 DIAGNOSIS — Z8759 Personal history of other complications of pregnancy, childbirth and the puerperium: Secondary | ICD-10-CM

## 2022-08-26 DIAGNOSIS — J4520 Mild intermittent asthma, uncomplicated: Secondary | ICD-10-CM

## 2022-08-26 NOTE — Progress Notes (Signed)
   PRENATAL VISIT NOTE  Subjective:  Margaret Sanchez is a 32 y.o. [phone] at [redacted]w[redacted]d being seen today for ongoing prenatal care.  She is currently monitored for the following issues for this high-risk pregnancy and has History of IUFD; Mild intermittent asthma; Hx of preeclampsia, prior pregnancy, currently pregnant; Supervision of high-risk pregnancy; and Anemia in pregnancy, third trimester on their problem list.  Patient reports  doing well . Seen in MAU 2 days ago for RLQ pain, now getting treated for BV/yeast. Symptoms resolved.  Contractions: Not present. Vag. Bleeding: None.  Movement: Present. Denies leaking of fluid.   The following portions of the patient's history were reviewed and updated as appropriate: allergies, current medications, past family history, past medical history, past social history, past surgical history and problem list.   Objective:   Vitals:   08/26/22 0900  BP: 99/67  Pulse: 67  Weight: 202 lb (91.6 kg)    Fetal Status: Fetal Heart Rate (bpm): 140   Movement: Present     General:  Alert, oriented and cooperative. Patient is in no acute distress.  Skin: Skin is warm and dry. No rash noted.   Cardiovascular: Normal heart rate noted  Respiratory: Normal respiratory effort, no problems with respiration noted  Abdomen: Soft, gravid, appropriate for gestational age.  Pain/Pressure: Absent      Assessment and Plan:  Pregnancy: [id number] at [redacted]w[redacted]d 1. Supervision of high risk pregnancy in third trimester 2. [redacted] weeks gestation of pregnancy Tdap today Preterm labor symptoms and general obstetric precautions including but not limited to vaginal bleeding, contractions, leaking of fluid and fetal movement were reviewed in detail with the patient.  3. Anemia in pregnancy, third trimester Taking PO iron  4. History of IUFD 5. Hx of preeclampsia, prior pregnancy, currently pregnant Normotensive today ldASA @28 /6: 1414g (64%), AC 65%, 19.48, breech, anterior - next  growth scheduled 8/5 Weekly BPPs scheduled  6. Mild intermittent asthma without complication No complaints  Please refer to After Visit Summary for other counseling recommendations.   Return in about 2 weeks (around 09/09/2022) for return OB at 33 weeks.  Future Appointments  Date Time Provider Department Center  09/02/2022 11:15 AM [facility] NURSE [facility]  09/02/2022 11:30 AM [facility] US3 [facility]  09/12/2022  1:45 PM [facility] US4 [facility]  09/19/2022 10:45 AM [facility] US4 [facility]  09/26/2022 12:45 PM [facility] US5 [facility]  10/03/2022  9:30 AM [facility] US3 [facility]    Lennart Pall, MD

## 2022-08-26 NOTE — Progress Notes (Signed)
Pt is set up with MFM for weekly BPP's.

## 2022-09-02 ENCOUNTER — Ambulatory Visit: Payer: Medicaid Other

## 2022-09-04 ENCOUNTER — Ambulatory Visit: Payer: Medicaid Other | Attending: Obstetrics

## 2022-09-04 ENCOUNTER — Ambulatory Visit: Payer: Medicaid Other

## 2022-09-04 VITALS — BP 105/68 | HR 73

## 2022-09-04 DIAGNOSIS — O99013 Anemia complicating pregnancy, third trimester: Secondary | ICD-10-CM

## 2022-09-04 DIAGNOSIS — Z8759 Personal history of other complications of pregnancy, childbirth and the puerperium: Secondary | ICD-10-CM | POA: Insufficient documentation

## 2022-09-04 DIAGNOSIS — O99213 Obesity complicating pregnancy, third trimester: Secondary | ICD-10-CM | POA: Diagnosis not present

## 2022-09-04 DIAGNOSIS — O0993 Supervision of high risk pregnancy, unspecified, third trimester: Secondary | ICD-10-CM

## 2022-09-04 DIAGNOSIS — O09299 Supervision of pregnancy with other poor reproductive or obstetric history, unspecified trimester: Secondary | ICD-10-CM | POA: Diagnosis present

## 2022-09-04 DIAGNOSIS — J45909 Unspecified asthma, uncomplicated: Secondary | ICD-10-CM

## 2022-09-04 DIAGNOSIS — E669 Obesity, unspecified: Secondary | ICD-10-CM

## 2022-09-04 DIAGNOSIS — O99513 Diseases of the respiratory system complicating pregnancy, third trimester: Secondary | ICD-10-CM

## 2022-09-04 DIAGNOSIS — Z3A32 32 weeks gestation of pregnancy: Secondary | ICD-10-CM

## 2022-09-04 DIAGNOSIS — O09293 Supervision of pregnancy with other poor reproductive or obstetric history, third trimester: Secondary | ICD-10-CM | POA: Diagnosis not present

## 2022-09-09 ENCOUNTER — Ambulatory Visit (INDEPENDENT_AMBULATORY_CARE_PROVIDER_SITE_OTHER): Payer: Medicaid Other | Admitting: Obstetrics & Gynecology

## 2022-09-09 VITALS — BP 110/72 | HR 67 | Wt 198.0 lb

## 2022-09-09 DIAGNOSIS — O09293 Supervision of pregnancy with other poor reproductive or obstetric history, third trimester: Secondary | ICD-10-CM

## 2022-09-09 DIAGNOSIS — Z3A33 33 weeks gestation of pregnancy: Secondary | ICD-10-CM

## 2022-09-09 DIAGNOSIS — Z8759 Personal history of other complications of pregnancy, childbirth and the puerperium: Secondary | ICD-10-CM

## 2022-09-09 DIAGNOSIS — O09299 Supervision of pregnancy with other poor reproductive or obstetric history, unspecified trimester: Secondary | ICD-10-CM

## 2022-09-09 DIAGNOSIS — O0993 Supervision of high risk pregnancy, unspecified, third trimester: Secondary | ICD-10-CM

## 2022-09-09 DIAGNOSIS — J4520 Mild intermittent asthma, uncomplicated: Secondary | ICD-10-CM

## 2022-09-09 NOTE — Progress Notes (Unsigned)
Pt complains of left lower back pain

## 2022-09-09 NOTE — Progress Notes (Unsigned)
   PRENATAL VISIT NOTE  Subjective:  Margaret Sanchez is a 32 y.o. [phone] at [redacted]w[redacted]d being seen today for ongoing prenatal care.  She is currently monitored for the following issues for this high-risk pregnancy and has History of IUFD; Mild intermittent asthma; Hx of preeclampsia, prior pregnancy, currently pregnant; Supervision of high-risk pregnancy; and Anemia in pregnancy, third trimester on their problem list.  Patient reports no complaints.  Contractions: Not present. Vag. Bleeding: None.  Movement: Present. Denies leaking of fluid.   The following portions of the patient's history were reviewed and updated as appropriate: allergies, current medications, past family history, past medical history, past social history, past surgical history and problem list.   Objective:   Vitals:   09/09/22 0922  BP: 110/72  Pulse: 67  Weight: 198 lb (89.8 kg)    Fetal Status: Fetal Heart Rate (bpm): 140   Movement: Present     General:  Alert, oriented and cooperative. Patient is in no acute distress.  Skin: Skin is warm and dry. No rash noted.   Cardiovascular: Normal heart rate noted  Respiratory: Normal respiratory effort, no problems with respiration noted  Abdomen: Soft, gravid, appropriate for gestational age.  Pain/Pressure: Present     Pelvic: Cervical exam deferred        Extremities: Normal range of motion.     Mental Status: Normal mood and affect. Normal behavior. Normal judgment and thought content.   Assessment and Plan:  Pregnancy: [id number] at [redacted]w[redacted]d There are no diagnoses linked to this encounter. Preterm labor symptoms and general obstetric precautions including but not limited to vaginal bleeding, contractions, leaking of fluid and fetal movement were reviewed in detail with the patient. Please refer to After Visit Summary for other counseling recommendations.  History of IUFD  Supervision of high risk pregnancy in third trimester  Hx of preeclampsia, prior pregnancy,  currently pregnant  Mild intermittent asthma without complication  Return in about 2 weeks (around 09/23/2022).  Future Appointments  Date Time Provider Department Center  09/12/2022  1:45 PM [facility] US4 [facility]  09/19/2022 10:45 AM [facility] US4 [facility]  09/26/2022 12:45 PM [facility] US5 [facility]  10/03/2022  9:30 AM [facility] US3 [facility]    Scheryl Darter, MD

## 2022-09-12 ENCOUNTER — Ambulatory Visit: Payer: Medicaid Other

## 2022-09-19 ENCOUNTER — Ambulatory Visit: Payer: Medicaid Other | Attending: Obstetrics and Gynecology

## 2022-09-19 ENCOUNTER — Encounter: Payer: Self-pay | Admitting: *Deleted

## 2022-09-19 DIAGNOSIS — Z8759 Personal history of other complications of pregnancy, childbirth and the puerperium: Secondary | ICD-10-CM | POA: Insufficient documentation

## 2022-09-19 DIAGNOSIS — O09293 Supervision of pregnancy with other poor reproductive or obstetric history, third trimester: Secondary | ICD-10-CM | POA: Diagnosis not present

## 2022-09-19 DIAGNOSIS — Z3A34 34 weeks gestation of pregnancy: Secondary | ICD-10-CM

## 2022-09-19 DIAGNOSIS — O99513 Diseases of the respiratory system complicating pregnancy, third trimester: Secondary | ICD-10-CM

## 2022-09-19 DIAGNOSIS — J45909 Unspecified asthma, uncomplicated: Secondary | ICD-10-CM

## 2022-09-19 DIAGNOSIS — O99213 Obesity complicating pregnancy, third trimester: Secondary | ICD-10-CM

## 2022-09-19 DIAGNOSIS — E669 Obesity, unspecified: Secondary | ICD-10-CM

## 2022-09-23 ENCOUNTER — Encounter: Payer: Medicaid Other | Admitting: Obstetrics and Gynecology

## 2022-09-26 ENCOUNTER — Encounter: Payer: Self-pay | Admitting: Obstetrics and Gynecology

## 2022-09-26 ENCOUNTER — Other Ambulatory Visit (HOSPITAL_COMMUNITY)
Admission: RE | Admit: 2022-09-26 | Discharge: 2022-09-26 | Disposition: A | Discharge status: Home / Self Care | Payer: Medicaid Other | Source: Ambulatory Visit | Attending: Obstetrics and Gynecology | Admitting: Obstetrics and Gynecology

## 2022-09-26 ENCOUNTER — Ambulatory Visit: Payer: Medicaid Other | Attending: Obstetrics and Gynecology

## 2022-09-26 ENCOUNTER — Ambulatory Visit (INDEPENDENT_AMBULATORY_CARE_PROVIDER_SITE_OTHER): Payer: Medicaid Other | Admitting: Obstetrics and Gynecology

## 2022-09-26 VITALS — BP 114/76 | HR 70 | Wt 198.8 lb

## 2022-09-26 DIAGNOSIS — O0993 Supervision of high risk pregnancy, unspecified, third trimester: Secondary | ICD-10-CM | POA: Diagnosis not present

## 2022-09-26 DIAGNOSIS — Z3493 Encounter for supervision of normal pregnancy, unspecified, third trimester: Secondary | ICD-10-CM | POA: Diagnosis present

## 2022-09-26 DIAGNOSIS — Z3A36 36 weeks gestation of pregnancy: Secondary | ICD-10-CM | POA: Diagnosis not present

## 2022-09-26 DIAGNOSIS — O99820 Streptococcus B carrier state complicating pregnancy: Secondary | ICD-10-CM | POA: Diagnosis not present

## 2022-09-26 DIAGNOSIS — O09299 Supervision of pregnancy with other poor reproductive or obstetric history, unspecified trimester: Secondary | ICD-10-CM

## 2022-09-26 DIAGNOSIS — Z8759 Personal history of other complications of pregnancy, childbirth and the puerperium: Secondary | ICD-10-CM

## 2022-09-26 NOTE — Progress Notes (Signed)
Pt presents for ROB visit. Pt wants to know induction date.

## 2022-09-26 NOTE — Progress Notes (Signed)
   PRENATAL VISIT NOTE  Subjective:  Margaret Sanchez is a 32 y.o. [phone] at [redacted]w[redacted]d being seen today for ongoing prenatal care.  She is currently monitored for the following issues for this high-risk pregnancy and has History of IUFD; Hx of preeclampsia, prior pregnancy, currently pregnant; Supervision of high-risk pregnancy; and Anemia in pregnancy, third trimester on their problem list.  Patient reports no complaints.  Contractions: Not present. Vag. Bleeding: None.  Movement: Present. Denies leaking of fluid.   The following portions of the patient's history were reviewed and updated as appropriate: allergies, current medications, past family history, past medical history, past social history, past surgical history and problem list.   Objective:   Vitals:   09/26/22 1541  BP: 114/76  Pulse: 70  Weight: 198 lb 12.8 oz (90.2 kg)    Fetal Status: Fetal Heart Rate (bpm): 137   Movement: Present     General:  Alert, oriented and cooperative. Patient is in no acute distress.  Skin: Skin is warm and dry. No rash noted.   Cardiovascular: Normal heart rate noted  Respiratory: Normal respiratory effort, no problems with respiration noted  Abdomen: Soft, gravid, appropriate for gestational age.  Pain/Pressure: Absent     Pelvic: Cervical exam deferred        Extremities: Normal range of motion.  Edema: None  Mental Status: Normal mood and affect. Normal behavior. Normal judgment and thought content.   Assessment and Plan:  Pregnancy: [id number] at [redacted]w[redacted]d 1. Supervision of high risk pregnancy in third trimester BP and FHR normal FH appropriate Feeling regular fetal movement  2. History of IUFD 2021 8/22  AFI nml, BPP 8/8, per MFM, recommend del around 39 wks Continue weekly testing and growth u/s  Precautions discussed IOL 39 wks per mfm, scheduled 9/20  3. Hx of preeclampsia, prior pregnancy, currently pregnant Normotensive, precautions discussed  4. [redacted] weeks gestation of  pregnancy Swabs today - Culture, beta strep (group b only) - Cervicovaginal ancillary only( [place])   Preterm labor symptoms and general obstetric precautions including but not limited to vaginal bleeding, contractions, leaking of fluid and fetal movement were reviewed in detail with the patient. Please refer to After Visit Summary for other counseling recommendations.   Return in about 1 week (around 10/03/2022) for OB VISIT (MD or APP).  Future Appointments  Date Time Provider Department Center  10/03/2022  9:30 AM [facility] US3 [facility]  10/03/2022  3:30 PM Warden Fillers, MD [facility] None  10/18/2022 12:00 AM [facility] SCHED ROOM [facility] None    Albertine Grates, FNP

## 2022-09-29 LAB — CULTURE, BETA STREP (GROUP B ONLY): Strep Gp B Culture: POSITIVE — AB

## 2022-09-30 ENCOUNTER — Encounter: Payer: Self-pay | Admitting: Obstetrics and Gynecology

## 2022-09-30 DIAGNOSIS — O99820 Streptococcus B carrier state complicating pregnancy: Secondary | ICD-10-CM | POA: Insufficient documentation

## 2022-10-03 ENCOUNTER — Encounter (HOSPITAL_COMMUNITY): Payer: Self-pay | Admitting: Obstetrics and Gynecology

## 2022-10-03 ENCOUNTER — Ambulatory Visit (INDEPENDENT_AMBULATORY_CARE_PROVIDER_SITE_OTHER): Payer: Medicaid Other | Admitting: Obstetrics and Gynecology

## 2022-10-03 ENCOUNTER — Ambulatory Visit: Payer: Medicaid Other | Attending: Obstetrics and Gynecology

## 2022-10-03 ENCOUNTER — Inpatient Hospital Stay (HOSPITAL_COMMUNITY)
Admission: AD | Admit: 2022-10-03 | Discharge: 2022-10-03 | Disposition: A | Discharge status: Home / Self Care | Payer: Medicaid Other | Attending: Obstetrics and Gynecology | Admitting: Obstetrics and Gynecology

## 2022-10-03 VITALS — BP 105/70 | HR 82 | Wt 198.0 lb

## 2022-10-03 DIAGNOSIS — Z8759 Personal history of other complications of pregnancy, childbirth and the puerperium: Secondary | ICD-10-CM

## 2022-10-03 DIAGNOSIS — O0993 Supervision of high risk pregnancy, unspecified, third trimester: Secondary | ICD-10-CM

## 2022-10-03 DIAGNOSIS — Z3A36 36 weeks gestation of pregnancy: Secondary | ICD-10-CM | POA: Diagnosis not present

## 2022-10-03 DIAGNOSIS — O09293 Supervision of pregnancy with other poor reproductive or obstetric history, third trimester: Secondary | ICD-10-CM | POA: Insufficient documentation

## 2022-10-03 DIAGNOSIS — O09299 Supervision of pregnancy with other poor reproductive or obstetric history, unspecified trimester: Secondary | ICD-10-CM

## 2022-10-03 DIAGNOSIS — O99013 Anemia complicating pregnancy, third trimester: Secondary | ICD-10-CM

## 2022-10-03 DIAGNOSIS — Z3689 Encounter for other specified antenatal screening: Secondary | ICD-10-CM | POA: Diagnosis not present

## 2022-10-03 DIAGNOSIS — O99820 Streptococcus B carrier state complicating pregnancy: Secondary | ICD-10-CM

## 2022-10-03 LAB — CERVICOVAGINAL ANCILLARY ONLY
Chlamydia: NEGATIVE
Comment: NEGATIVE
Comment: NEGATIVE
Comment: NORMAL
Neisseria Gonorrhea: NEGATIVE
Trichomonas: NEGATIVE

## 2022-10-03 NOTE — Progress Notes (Signed)
   PRENATAL VISIT NOTE  Subjective:  Hamnah Mcgurk is a 32 y.o. [phone] at [redacted]w[redacted]d being seen today for ongoing prenatal care.  She is currently monitored for the following issues for this high-risk pregnancy and has History of IUFD; Hx of preeclampsia, prior pregnancy, currently pregnant; Supervision of high-risk pregnancy; Anemia in pregnancy, third trimester; and Group B Streptococcus carrier, +RV culture, currently pregnant on their problem list.  Patient doing well with no acute concerns today. She reports no complaints.  Contractions: Not present. Vag. Bleeding: None.  Movement: Present. Denies leaking of fluid.   The following portions of the patient's history were reviewed and updated as appropriate: allergies, current medications, past family history, past medical history, past social history, past surgical history and problem list. Problem list updated.  Objective:   Vitals:   10/03/22 1547  BP: 105/70  Pulse: 82  Weight: 198 lb (89.8 kg)    Fetal Status:   Fundal Height: 37 cm Movement: Present     General:  Alert, oriented and cooperative. Patient is in no acute distress.  Skin: Skin is warm and dry. No rash noted.   Cardiovascular: Normal heart rate noted  Respiratory: Normal respiratory effort, no problems with respiration noted  Abdomen: Soft, gravid, appropriate for gestational age.  Pain/Pressure: Absent     Pelvic: Cervical exam deferred        Extremities: Normal range of motion.  Edema: None  Mental Status:  Normal mood and affect. Normal behavior. Normal judgment and thought content.   Assessment and Plan:  Pregnancy: G4P2102 at [redacted]w[redacted]d  1. [redacted] weeks gestation of pregnancy   2. Anemia in pregnancy, third trimester   3. Group B Streptococcus carrier, +RV culture, currently pregnant Treat in labor  4. History of IUFD Weekly BPP, IOL on 9/20  5. Hx of preeclampsia, prior pregnancy, currently pregnant No s/sx of preeclampsia  6. Supervision of high risk  pregnancy in third trimester Continue routine prenatal care  7. Fetal arrhythmia before the onset of labor Audible arrhythmia could be heard by nursing staff and MD.  Sounds like possible dropped beats.  Due to IUFD hx, pt sent to MAU for further evaluation and possible BPP, pt missed her fetal testing today  Preterm labor symptoms and general obstetric precautions including but not limited to vaginal bleeding, contractions, leaking of fluid and fetal movement were reviewed in detail with the patient.  Please refer to After Visit Summary for other counseling recommendations.   Return in about 1 week (around 10/10/2022) for [facility], in person.   Mariel Aloe, MD Faculty Attending Center for [facility]

## 2022-10-03 NOTE — MAU Note (Signed)
.  Margaret Sanchez is a 32 y.o. at [redacted]w[redacted]d here in MAU reporting: sent from office for monitoring. Possible fetal arrhythmia . Denies any pain or cramping. Good fetal movement reported.  LMP:  Onset of complaint: today  Pain score: 0 There were no vitals filed for this visit.   FHT:145 Lab orders placed from triage:

## 2022-10-03 NOTE — MAU Provider Note (Signed)
History     CSN: [id number]  Arrival date and time: 10/03/22 1748   Event Date/Time   First Provider Initiated Contact with Patient 10/03/22 1843      Chief Complaint  Patient presents with   Non-stress Test   HPI  [name] Is a 32 y.o. female [phone] @ [redacted]w[redacted]d here in MAU with complaints of possible fetal arrhythmia. She was in the office today for a regular prenatal visit and their was concern for fetal arrhythmia. She reports good fetal movement. No other complaints.  Hx of IUFD- feeling anxious.   OB History     Gravida  4   Para  3   Term  2   Preterm  1   AB      Living  2      SAB      IAB      Ectopic      Multiple  0   Live Births  2           Past Medical History:  Diagnosis Date   Asthma    Depression    History of chlamydia    Hypertension    postpartum   Mild intermittent asthma 12/28/2018    Past Surgical History:  Procedure Laterality Date   ADENOIDECTOMY     arm surgery Right    TONSILLECTOMY      Family History  Problem Relation Age of Onset   Hypertension Mother    Asthma Sister    Asthma Brother    Asthma Brother    Asthma Brother    Asthma Brother    Diabetes Maternal Grandmother    Hypertension Maternal Grandmother    Hypertension Paternal Grandmother    Stroke Neg Hx     Social History   Tobacco Use   Smoking status: Never   Smokeless tobacco: Never  Vaping Use   Vaping status: Never Used  Substance Use Topics   Alcohol use: Not Currently    Comment: occasionally, prior to pregnancy   Drug use: Not Currently    Types: Marijuana    Comment: not since confirmed pregnancy    Allergies: No Known Allergies  Medications Prior to Admission  Medication Sig Dispense Refill Last Dose   aspirin EC 81 MG tablet Take 1 tablet (81 mg total) by mouth daily. Take after 12 weeks for prevention of preeclampsia later in pregnancy 300 tablet 2 10/03/2022   ferrous sulfate 325 (65 FE) MG EC tablet Take 1 tablet  (325 mg total) by mouth every other day. 45 tablet 1 10/02/2022   Prenatal Vit-Fe Fumarate-FA (PRENATAL MULTIVITAMIN) TABS tablet Take 1 tablet by mouth daily at 12 noon.   10/03/2022   albuterol (VENTOLIN HFA) 108 (90 Base) MCG/ACT inhaler Inhale 2 puffs into the lungs every 6 (six) hours as needed for wheezing or shortness of breath. (Patient not taking: Reported on 10/03/2022)      Blood Pressure Monitoring (BLOOD PRESSURE KIT) KIT 1 Device by Does not apply route as needed. (Patient not taking: Reported on 09/26/2022) 1 kit 0    No results found for this or any previous visit (from the past 48 hour(s)).   Review of Systems  Gastrointestinal:  Negative for abdominal pain.  Genitourinary:  Negative for vaginal bleeding.   Physical Exam   Blood pressure 113/68, pulse 76, temperature 98.6 F (37 C), resp. rate 18, height 5\' 4"  (1.626 m), last menstrual period 01/18/2022, SpO2 100%, unknown if currently breastfeeding.  Physical Exam  Constitutional:      General: She is not in acute distress.    Appearance: Normal appearance. She is not ill-appearing, toxic-appearing or diaphoretic.  Skin:    General: Skin is warm.  Neurological:     Mental Status: She is alert and oriented to person, place, and time.  Psychiatric:        Behavior: Behavior normal.   Fetal Tracing: Baseline: 135 bpm Variability: Moderate  Accelerations: 15x15 Decelerations: None Toco: UI  MAU Course  Procedures  MDM  Reviewed tracing with Dr. Alysia Penna. Ok for DC home.   Assessment and Plan   A:  1. NST (non-stress test) reactive   2. [redacted] weeks gestation of pregnancy      P:  Dc home Keep follow up with OB Return to MAU as needed Fetal kick counts  Channelle Bottger, Harolyn Rutherford, NP 10/03/2022 8:43 PM

## 2022-10-03 NOTE — Progress Notes (Signed)
Pt presents for ROB. 

## 2022-10-10 ENCOUNTER — Ambulatory Visit (INDEPENDENT_AMBULATORY_CARE_PROVIDER_SITE_OTHER): Payer: Medicaid Other | Admitting: Student

## 2022-10-10 VITALS — BP 106/71 | HR 79 | Wt 200.0 lb

## 2022-10-10 DIAGNOSIS — Z3A37 37 weeks gestation of pregnancy: Secondary | ICD-10-CM

## 2022-10-10 DIAGNOSIS — Z8759 Personal history of other complications of pregnancy, childbirth and the puerperium: Secondary | ICD-10-CM

## 2022-10-10 DIAGNOSIS — O99013 Anemia complicating pregnancy, third trimester: Secondary | ICD-10-CM

## 2022-10-10 DIAGNOSIS — O0993 Supervision of high risk pregnancy, unspecified, third trimester: Secondary | ICD-10-CM

## 2022-10-10 DIAGNOSIS — O09299 Supervision of pregnancy with other poor reproductive or obstetric history, unspecified trimester: Secondary | ICD-10-CM

## 2022-10-10 DIAGNOSIS — O99820 Streptococcus B carrier state complicating pregnancy: Secondary | ICD-10-CM

## 2022-10-10 NOTE — Progress Notes (Signed)
   PRENATAL VISIT NOTE  Subjective:  Margaret Sanchez is a 32 y.o. [phone] at [redacted]w[redacted]d being seen today for ongoing prenatal care.  She is currently monitored for the following issues for this low-risk pregnancy and has History of IUFD; Hx of preeclampsia, prior pregnancy, currently pregnant; Supervision of high-risk pregnancy; Anemia in pregnancy, third trimester; and Group B Streptococcus carrier, +RV culture, currently pregnant on their problem list.  Patient reports no complaints.  Contractions: Not present. Vag. Bleeding: None.  Movement: Present. Denies leaking of fluid.   The following portions of the patient's history were reviewed and updated as appropriate: allergies, current medications, past family history, past medical history, past social history, past surgical history and problem list.   Objective:   Vitals:   10/10/22 1601  BP: 106/71  Pulse: 79  Weight: 200 lb (90.7 kg)    Fetal Status: Fetal Heart Rate (bpm): 140   Movement: Present     General:  Alert, oriented and cooperative. Patient is in no acute distress.  Skin: Skin is warm and dry. No rash noted.   Cardiovascular: Normal heart rate noted  Respiratory: Normal respiratory effort, no problems with respiration noted  Abdomen: Soft, gravid, appropriate for gestational age.  Pain/Pressure: Present     Pelvic: Cervical exam deferred        Extremities: Normal range of motion.     Mental Status: Normal mood and affect. Normal behavior. Normal judgment and thought content.   Assessment and Plan:  Pregnancy: A5W0981 at [redacted]w[redacted]d 1. Supervision of high risk pregnancy in third trimester - frequent and vigorous fetal movement reported and noted on exam  2. [redacted] weeks gestation of pregnancy - continue weekly therapy  3. Anemia in pregnancy, third trimester - PO therapy  4. Group B Streptococcus carrier, +RV culture, currently pregnant - Prophylaxis in labor  5. History of IUFD - Weekly BPP, IOL on 9/20   6. Hx of  preeclampsia, prior pregnancy, currently pregnant - No s/sx of preeclampsia   Term labor symptoms and general obstetric precautions including but not limited to vaginal bleeding, contractions, leaking of fluid and fetal movement were reviewed in detail with the patient. Please refer to After Visit Summary for other counseling recommendations.   Return in about 1 week (around 10/17/2022) for LOB, IN-PERSON.  Future Appointments  Date Time Provider Department Center  10/17/2022  3:30 PM Conan Bowens, MD [facility] None  10/18/2022 12:00 AM [facility] SCHED ROOM [facility] None    Corlis Hove, NP

## 2022-10-11 ENCOUNTER — Telehealth (HOSPITAL_COMMUNITY): Payer: Self-pay | Admitting: *Deleted

## 2022-10-11 ENCOUNTER — Encounter (HOSPITAL_COMMUNITY): Payer: Self-pay | Admitting: *Deleted

## 2022-10-11 NOTE — Telephone Encounter (Signed)
Preadmission screen  

## 2022-10-17 ENCOUNTER — Other Ambulatory Visit: Payer: Self-pay | Admitting: Advanced Practice Midwife

## 2022-10-17 ENCOUNTER — Encounter: Payer: Medicaid Other | Admitting: Obstetrics and Gynecology

## 2022-10-18 ENCOUNTER — Inpatient Hospital Stay (HOSPITAL_COMMUNITY): Payer: Medicaid Other | Admitting: Anesthesiology

## 2022-10-18 ENCOUNTER — Inpatient Hospital Stay (HOSPITAL_COMMUNITY): Payer: Medicaid Other

## 2022-10-18 ENCOUNTER — Other Ambulatory Visit: Payer: Self-pay

## 2022-10-18 ENCOUNTER — Inpatient Hospital Stay (HOSPITAL_COMMUNITY)
Admission: AD | Admit: 2022-10-18 | Discharge: 2022-10-20 | DRG: 807 | Disposition: A | Discharge status: Home / Self Care | Payer: Medicaid Other | Attending: Family Medicine | Admitting: Family Medicine

## 2022-10-18 ENCOUNTER — Encounter (HOSPITAL_COMMUNITY): Payer: Self-pay | Admitting: Obstetrics and Gynecology

## 2022-10-18 DIAGNOSIS — Z148 Genetic carrier of other disease: Secondary | ICD-10-CM

## 2022-10-18 DIAGNOSIS — Z825 Family history of asthma and other chronic lower respiratory diseases: Secondary | ICD-10-CM | POA: Diagnosis not present

## 2022-10-18 DIAGNOSIS — Z8249 Family history of ischemic heart disease and other diseases of the circulatory system: Secondary | ICD-10-CM

## 2022-10-18 DIAGNOSIS — O9952 Diseases of the respiratory system complicating childbirth: Secondary | ICD-10-CM | POA: Diagnosis present

## 2022-10-18 DIAGNOSIS — O9902 Anemia complicating childbirth: Secondary | ICD-10-CM | POA: Diagnosis present

## 2022-10-18 DIAGNOSIS — Z8759 Personal history of other complications of pregnancy, childbirth and the puerperium: Secondary | ICD-10-CM

## 2022-10-18 DIAGNOSIS — J4520 Mild intermittent asthma, uncomplicated: Secondary | ICD-10-CM | POA: Diagnosis present

## 2022-10-18 DIAGNOSIS — O09293 Supervision of pregnancy with other poor reproductive or obstetric history, third trimester: Secondary | ICD-10-CM | POA: Diagnosis not present

## 2022-10-18 DIAGNOSIS — O0993 Supervision of high risk pregnancy, unspecified, third trimester: Secondary | ICD-10-CM

## 2022-10-18 DIAGNOSIS — O09299 Supervision of pregnancy with other poor reproductive or obstetric history, unspecified trimester: Principal | ICD-10-CM

## 2022-10-18 DIAGNOSIS — Z23 Encounter for immunization: Secondary | ICD-10-CM | POA: Diagnosis not present

## 2022-10-18 DIAGNOSIS — O99013 Anemia complicating pregnancy, third trimester: Secondary | ICD-10-CM

## 2022-10-18 DIAGNOSIS — O99824 Streptococcus B carrier state complicating childbirth: Secondary | ICD-10-CM | POA: Diagnosis present

## 2022-10-18 DIAGNOSIS — O99820 Streptococcus B carrier state complicating pregnancy: Secondary | ICD-10-CM | POA: Diagnosis not present

## 2022-10-18 DIAGNOSIS — Z3A39 39 weeks gestation of pregnancy: Secondary | ICD-10-CM | POA: Diagnosis not present

## 2022-10-18 DIAGNOSIS — Z833 Family history of diabetes mellitus: Secondary | ICD-10-CM

## 2022-10-18 DIAGNOSIS — O26893 Other specified pregnancy related conditions, third trimester: Secondary | ICD-10-CM | POA: Diagnosis present

## 2022-10-18 LAB — CBC
HCT: 29.7 % — ABNORMAL LOW (ref 36.0–46.0)
Hemoglobin: 9.6 g/dL — ABNORMAL LOW (ref 12.0–15.0)
MCH: 26.8 pg (ref 26.0–34.0)
MCHC: 32.3 g/dL (ref 30.0–36.0)
MCV: 83 fL (ref 80.0–100.0)
Platelets: 310 10*3/uL (ref 150–400)
RBC: 3.58 MIL/uL — ABNORMAL LOW (ref 3.87–5.11)
RDW: 13.8 % (ref 11.5–15.5)
WBC: 6.5 10*3/uL (ref 4.0–10.5)
nRBC: 0 % (ref 0.0–0.2)

## 2022-10-18 LAB — RPR: RPR Ser Ql: NONREACTIVE

## 2022-10-18 LAB — TYPE AND SCREEN
ABO/RH(D): O POS
Antibody Screen: NEGATIVE

## 2022-10-18 MED ORDER — SODIUM CHLORIDE 0.9 % IV SOLN
5.0000 10*6.[IU] | Freq: Once | INTRAVENOUS | Status: AC
  Administered 2022-10-18: 5 10*6.[IU] via INTRAVENOUS
  Filled 2022-10-18: qty 5

## 2022-10-18 MED ORDER — OXYTOCIN-SODIUM CHLORIDE 30-0.9 UT/500ML-% IV SOLN
2.5000 [IU]/h | INTRAVENOUS | Status: DC
  Administered 2022-10-19: 2.5 [IU]/h via INTRAVENOUS

## 2022-10-18 MED ORDER — PHENYLEPHRINE 80 MCG/ML (10ML) SYRINGE FOR IV PUSH (FOR BLOOD PRESSURE SUPPORT)
80.0000 ug | PREFILLED_SYRINGE | INTRAVENOUS | Status: DC | PRN
  Administered 2022-10-18 (×2): 80 ug via INTRAVENOUS

## 2022-10-18 MED ORDER — OXYTOCIN BOLUS FROM INFUSION
333.0000 mL | Freq: Once | INTRAVENOUS | Status: AC
  Administered 2022-10-19: 333 mL via INTRAVENOUS

## 2022-10-18 MED ORDER — LIDOCAINE-EPINEPHRINE (PF) 1.5 %-1:200000 IJ SOLN
INTRAMUSCULAR | Status: DC | PRN
  Administered 2022-10-18: 5 mL via EPIDURAL

## 2022-10-18 MED ORDER — EPHEDRINE 5 MG/ML INJ: 10.0000 mg | INTRAVENOUS | Status: DC | PRN

## 2022-10-18 MED ORDER — PENICILLIN G POT IN DEXTROSE 60000 UNIT/ML IV SOLN
3.0000 10*6.[IU] | INTRAVENOUS | Status: DC
  Administered 2022-10-18 (×2): 3 10*6.[IU] via INTRAVENOUS
  Filled 2022-10-18 (×4): qty 50

## 2022-10-18 MED ORDER — LACTATED RINGERS IV SOLN
500.0000 mL | INTRAVENOUS | Status: DC | PRN
  Administered 2022-10-18: 1000 mL via INTRAVENOUS
  Administered 2022-10-18: 500 mL via INTRAVENOUS

## 2022-10-18 MED ORDER — MISOPROSTOL 25 MCG QUARTER TABLET
25.0000 ug | ORAL_TABLET | Freq: Once | ORAL | Status: AC
  Administered 2022-10-18: 25 ug via VAGINAL
  Filled 2022-10-18: qty 1

## 2022-10-18 MED ORDER — LACTATED RINGERS IV SOLN: INTRAVENOUS | Status: DC

## 2022-10-18 MED ORDER — FENTANYL-BUPIVACAINE-NACL 0.5-0.125-0.9 MG/250ML-% EP SOLN
12.0000 mL/h | EPIDURAL | Status: DC | PRN
  Administered 2022-10-18: 12 mL/h via EPIDURAL
  Filled 2022-10-18: qty 250

## 2022-10-18 MED ORDER — DIPHENHYDRAMINE HCL 50 MG/ML IJ SOLN: 12.5000 mg | INTRAMUSCULAR | Status: DC | PRN

## 2022-10-18 MED ORDER — ACETAMINOPHEN 325 MG PO TABS: 650.0000 mg | ORAL_TABLET | ORAL | Status: DC | PRN

## 2022-10-18 MED ORDER — FENTANYL CITRATE (PF) 100 MCG/2ML IJ SOLN
50.0000 ug | INTRAMUSCULAR | Status: DC | PRN
  Administered 2022-10-18 (×2): 100 ug via INTRAVENOUS
  Filled 2022-10-18 (×2): qty 2

## 2022-10-18 MED ORDER — LIDOCAINE HCL (PF) 1 % IJ SOLN
INTRAMUSCULAR | Status: DC | PRN
  Administered 2022-10-18: 5 mL via EPIDURAL

## 2022-10-18 MED ORDER — LIDOCAINE HCL (PF) 1 % IJ SOLN: 30.0000 mL | INTRAMUSCULAR | Status: DC | PRN

## 2022-10-18 MED ORDER — OXYTOCIN-SODIUM CHLORIDE 30-0.9 UT/500ML-% IV SOLN
1.0000 m[IU]/min | INTRAVENOUS | Status: DC
  Filled 2022-10-18: qty 500

## 2022-10-18 MED ORDER — TERBUTALINE SULFATE 1 MG/ML IJ SOLN: 0.2500 mg | Freq: Once | INTRAMUSCULAR | Status: DC | PRN

## 2022-10-18 MED ORDER — LACTATED RINGERS IV SOLN
500.0000 mL | Freq: Once | INTRAVENOUS | Status: AC
  Administered 2022-10-18: 500 mL via INTRAVENOUS

## 2022-10-18 MED ORDER — ONDANSETRON HCL 4 MG/2ML IJ SOLN
4.0000 mg | Freq: Four times a day (QID) | INTRAMUSCULAR | Status: DC | PRN
  Administered 2022-10-18: 4 mg via INTRAVENOUS
  Filled 2022-10-18: qty 2

## 2022-10-18 MED ORDER — MISOPROSTOL 50MCG HALF TABLET
50.0000 ug | ORAL_TABLET | Freq: Once | ORAL | Status: AC
  Administered 2022-10-18: 50 ug via ORAL
  Filled 2022-10-18: qty 1

## 2022-10-18 MED ORDER — SOD CITRATE-CITRIC ACID 500-334 MG/5ML PO SOLN: 30.0000 mL | ORAL | Status: DC | PRN

## 2022-10-18 MED ORDER — PHENYLEPHRINE 80 MCG/ML (10ML) SYRINGE FOR IV PUSH (FOR BLOOD PRESSURE SUPPORT)
80.0000 ug | PREFILLED_SYRINGE | INTRAVENOUS | Status: DC | PRN
  Filled 2022-10-18: qty 10

## 2022-10-18 NOTE — Anesthesia Preprocedure Evaluation (Signed)
Anesthesia Evaluation  Patient identified by MRN, date of birth, ID band Patient awake    Reviewed: Allergy & Precautions, H&P , NPO status , Patient's Chart, lab work & pertinent test results  History of Anesthesia Complications Negative for: history of anesthetic complications  Airway Mallampati: II       Dental no notable dental hx.    Pulmonary asthma    Pulmonary exam normal        Cardiovascular hypertension, Normal cardiovascular exam     Neuro/Psych  PSYCHIATRIC DISORDERS  Depression    negative neurological ROS     GI/Hepatic negative GI ROS, Neg liver ROS,,,  Endo/Other  negative endocrine ROS    Renal/GU negative Renal ROS  negative genitourinary   Musculoskeletal   Abdominal  (+) + obese  Peds  Hematology  (+) Blood dyscrasia, anemia   Anesthesia Other Findings   Reproductive/Obstetrics (+) Pregnancy                             Anesthesia Physical Anesthesia Plan  ASA: 2  Anesthesia Plan: Epidural   Post-op Pain Management:    Induction:   PONV Risk Score and Plan:   Airway Management Planned:   Additional Equipment:   Intra-op Plan:   Post-operative Plan:   Informed Consent: I have reviewed the patients History and Physical, chart, labs and discussed the procedure including the risks, benefits and alternatives for the proposed anesthesia with the patient or authorized representative who has indicated his/her understanding and acceptance.       Plan Discussed with:   Anesthesia Plan Comments:        Anesthesia Quick Evaluation

## 2022-10-18 NOTE — Anesthesia Procedure Notes (Signed)
Epidural Patient location during procedure: OB Start time: 10/18/2022 8:14 PM End time: 10/18/2022 8:24 PM  Staffing Anesthesiologist: Leonides Grills, MD Performed: anesthesiologist   Preanesthetic Checklist Completed: patient identified, IV checked, site marked, risks and benefits discussed, monitors and equipment checked, pre-op evaluation and timeout performed  Epidural Patient position: sitting Prep: DuraPrep Patient monitoring: heart rate, cardiac monitor, continuous pulse ox and blood pressure Approach: midline Location: L4-L5 Injection technique: LOR air  Needle:  Needle type: Tuohy  Needle gauge: 17 G Needle length: 9 cm Needle insertion depth: 7 cm Catheter type: closed end flexible Catheter size: 19 Gauge Catheter at skin depth: 12 cm Test dose: negative and 1.5% lidocaine with Epi 1:200 K  Assessment Events: blood not aspirated, no cerebrospinal fluid, injection not painful, no injection resistance and negative IV test  Additional Notes Informed consent obtained prior to proceeding including risk of failure, 1% risk of PDPH, risk of minor discomfort and bruising. Discussed alternatives to epidural analgesia and patient desires to proceed.  Timeout performed pre-procedure verifying patient name, procedure, and platelet count.  Patient tolerated procedure well. Reason for block:procedure for pain

## 2022-10-18 NOTE — H&P (Cosign Needed Addendum)
OBSTETRIC ADMISSION HISTORY AND PHYSICAL  Margaret Sanchez is a 32 y.o. female [phone] with IUP at [redacted]w[redacted]d by [place] CRL presenting for IOL hx of IUFD. She reports +FMs, No LOF, no VB, no blurry vision, headaches or peripheral edema, and RUQ pain.  She plans on bottle feeding. She request Depo for birth control. She received her prenatal care at  [facility]    Dating: By [place] CRL --->  Estimated Date of Delivery: 10/25/22  Sono:    @[redacted]w[redacted]d , CWD, normal anatomy, cephalic presentation, anterior placental lie, 2046g, 42% EFW   Prenatal History/Complications:  Patient Active Problem List   Diagnosis Date Noted   Group B Streptococcus carrier, +RV culture, currently pregnant 09/30/2022   Anemia in pregnancy, third trimester 08/25/2022   Supervision of high-risk pregnancy 03/18/2022   Hx of preeclampsia, prior pregnancy, currently pregnant 09/07/2019   History of IUFD 09/01/2019   Nursing Staff Provider  Office Location [facility] Dating  10/25/2022, Date entered prior to episode creation  [facility] Model Arly.Keller ] Traditional [ ]  Centering [ ]  Mom-Baby Dyad      Language  English Anatomy US     Flu Vaccine  04/02/2022 Genetic/Carrier Screen  NIPS:   low risks AFP:    Horizon: alpha thal carrier (2021 results)  TDaP Vaccine   08/26/22 Hgb A1C or  GTT Early  Third trimester normal  COVID Vaccine Not vaccinated   LAB RESULTS   Rhogam  O/Positive/-- (02/19 1058)  Blood Type O pos  Baby Feeding Plan Bottle Antibody Negative (02/19 1058)  Contraception DEPO Rubella 1.50 (02/19 1058)  Circumcision Yes if a boy RPR Non Reactive (07/12 0959)   Pediatrician  [facility] HBsAg Negative (02/19 1058)   Support Person FOB-Phaytef HCVAb  Neg  Prenatal Classes   HIV  NR  BTL Consent   GBS (For PCN allergy, check sensitivities)   VBAC Consent   Pap             DME Rx Arly.Keller ] BP cuff [ ]  Weight Scale Waterbirth  [ ]  Class [ ]  Consent [ ]  CNM visit  PHQ9 & GAD7 [ X ] new OB [  ] 28 weeks  [  ] 36 weeks Induction  [ ]   Orders Entered [ ] Foley Y/N     Past Medical History: Past Medical History:  Diagnosis Date   Asthma    Depression    History of chlamydia    Hypertension    postpartum   Mild intermittent asthma 12/28/2018    Past Surgical History: Past Surgical History:  Procedure Laterality Date   ADENOIDECTOMY     arm surgery Right    TONSILLECTOMY      Obstetrical History: OB History     Gravida  4   Para  3   Term  2   Preterm  1   AB      Living  2      SAB      IAB      Ectopic      Multiple  0   Live Births  2           Social History Social History   Socioeconomic History   Marital status: Married    Spouse name: Not on file   Number of children: Not on file   Years of education: Not on file   Highest education level: Not on file  Occupational History   Not on file  Tobacco Use  Smoking status: Never   Smokeless tobacco: Never  Vaping Use   Vaping status: Never Used  Substance and Sexual Activity   Alcohol use: Not Currently    Comment: occasionally, prior to pregnancy   Drug use: Not Currently    Types: Marijuana    Comment: not since confirmed pregnancy   Sexual activity: Not Currently    Birth control/protection: None    Comment: Current pregnant  Other Topics Concern   Not on file  Social History Narrative   Not on file   Social Determinants of Health   Financial Resource Strain: Not on File (11/25/2019)   Received from [employer], [employer]    Financial Resource Strain: 0  Food Insecurity: No Food Insecurity (10/18/2022)   Hunger Vital Sign    Worried About Running Out of Food in the Last Year: Never true    Ran Out of Food in the Last Year: Never true  Transportation Needs: No Transportation Needs (10/18/2022)   PRAPARE - Administrator, Civil Service (Medical): No    Lack of Transportation (Non-Medical): No  Physical Activity: Not on File (11/25/2019)   Received from [place], [place]    Physical Activity    Physical Activity: 0  Stress: Not on File (11/25/2019)   Received from [place], [place]   Stress    Stress: 0  Social Connections: Unknown (06/12/2021)   Received from [facility], [facility]   Social Network    Social Network: Not on file    Family History: Family History  Problem Relation Age of Onset   Hypertension Mother    Asthma Sister    Asthma Brother    Asthma Brother    Asthma Brother    Asthma Brother    Diabetes Maternal Grandmother    Hypertension Maternal Grandmother    Hypertension Paternal Grandmother    Stroke Neg Hx     Allergies: No Known Allergies  Medications Prior to Admission  Medication Sig Dispense Refill Last Dose   albuterol (VENTOLIN HFA) 108 (90 Base) MCG/ACT inhaler Inhale 2 puffs into the lungs every 6 (six) hours as needed for wheezing or shortness of breath. (Patient not taking: Reported on 10/03/2022)      aspirin EC 81 MG tablet Take 1 tablet (81 mg total) by mouth daily. Take after 12 weeks for prevention of preeclampsia later in pregnancy 300 tablet 2    Blood Pressure Monitoring (BLOOD PRESSURE KIT) KIT 1 Device by Does not apply route as needed. (Patient not taking: Reported on 09/26/2022) 1 kit 0    ferrous sulfate 325 (65 FE) MG EC tablet Take 1 tablet (325 mg total) by mouth every other day. 45 tablet 1    Prenatal Vit-Fe Fumarate-FA (PRENATAL MULTIVITAMIN) TABS tablet Take 1 tablet by mouth daily at 12 noon.        Review of Systems   All systems reviewed and negative except as stated in HPI  Blood pressure (!) 110/57, pulse 61, resp. rate 16, height 5\' 4"  (1.626 m), weight 90.7 kg, last menstrual period 01/18/2022, SpO2 100%, unknown if currently breastfeeding. General appearance: alert, cooperative, and appears stated age Lungs: clear to auscultation bilaterally Heart: regular rate and rhythm Abdomen: soft, non-tender; bowel sounds normal Pelvic: normal female genitalia  Extremities: Homans sign is  negative, no sign of DVT Presentation: cephalic Fetal monitoringBaseline: 135 bpm, Variability: Good {> 6 bpm), Accelerations: Reactive, and Decelerations: Absent Uterine activityNone Dilation: 1 Effacement (%): Thick Station: [employer]  Exam by:: Colin Mulders, RN   Prenatal labs: ABO, Rh: --/--/O POS (09/20 1317) Antibody: NEG (09/20 1317) Rubella: 1.50 (02/19 1058) RPR: NON REACTIVE (09/20 1330)  HBsAg: Negative (02/19 1058)  HIV: Non Reactive (07/12 0959)  GBS: Positive/-- (08/29 1616)  2hr Glucola normal Genetic screening  LR, alpha thal carrier Anatomy US normal  Prenatal Transfer Tool  Maternal Diabetes: No Genetic Screening: Abnormal:  Results: Other: Alpha thal carrier  Maternal Ultrasounds/Referrals: Normal Fetal Ultrasounds or other Referrals:  None Maternal Substance Abuse:  No Significant Maternal Medications:  Meds include: Other:  albuterol inhaler Significant Maternal Lab Results:  Group B Strep positive Number of Prenatal Visits:greater than 3 verified prenatal visits Other Comments:  None  Results for orders placed or performed during the hospital encounter of 10/18/22 (from the past 24 hour(s))  Type and screen   Collection Time: 10/18/22  1:17 PM  Result Value Ref Range   ABO/RH(D) O POS    Antibody Screen NEG    Sample Expiration      10/21/2022,2359 Performed at [facility], [address] [place], [place] [address]   CBC   Collection Time: 10/18/22  1:30 PM  Result Value Ref Range   WBC 6.5 4.0 - 10.5 K/uL   RBC 3.58 (L) 3.87 - 5.11 MIL/uL   Hemoglobin 9.6 (L) 12.0 - 15.0 g/dL   HCT 60.4 (L) 54.0 - 98.1 %   MCV 83.0 80.0 - 100.0 fL   MCH 26.8 26.0 - 34.0 pg   MCHC 32.3 30.0 - 36.0 g/dL   RDW 19.1 47.8 - 29.5 %   Platelets 310 150 - 400 K/uL   nRBC 0.0 0.0 - 0.2 %  RPR   Collection Time: 10/18/22  1:30 PM  Result Value Ref Range   RPR Ser Ql NON REACTIVE NON REACTIVE    Patient Active Problem List   Diagnosis Date Noted    Group B Streptococcus carrier, +RV culture, currently pregnant 09/30/2022   Anemia in pregnancy, third trimester 08/25/2022   Supervision of high-risk pregnancy 03/18/2022   Hx of preeclampsia, prior pregnancy, currently pregnant 09/07/2019   History of IUFD 09/01/2019    Assessment/Plan:  Talaysha Rayle is a 32 y.o. G4P2102 at [redacted]w[redacted]d here for IOL for hx of IUFD  #Labor:start with cytotec, then FB vs pitocin / AROM  #Pain: Per patient request #FWB: Cat 1 #ID:  GBS pos, PNC  #MOF: bottle #MOC: Depo  #Circ:  Yes  #Hx of PreE in prior pregnancy  - CTM, normotensive since admission   #Hx of IUFD in prior pregnancy  #Asthma: albuterol rescue inhaler, pt report very light use - caution with hemabate   Hessie Dibble, MD  10/18/2022, 5:18 PM

## 2022-10-18 NOTE — Progress Notes (Signed)
Labor Progress Note Margaret Sanchez is a 32 y.o. [id number] at [redacted]w[redacted]d presented for IOL iso hx IUFD.  S: Doing well, more comfortable after epidural. No concerns or questions at this time.  O:  BP (!) 90/58   Pulse 72   Resp 16   Ht 5\' 4"  (1.626 m)   Wt 90.7 kg   LMP 01/18/2022   SpO2 99%   BMI 34.33 kg/m  EFM: 135/mod/+a/variable decel after epidural placement d/t low BP  CVE: Dilation: 3 Effacement (%): 50 Cervical Position: Middle Station: -3 Presentation: Vertex Exam by:: Veneta Penton, RN   A&P: 32 y.o. [id number] [redacted]w[redacted]d here for IOL iso hx IUFD #Labor: Progressing well. S/p epidural, more comfortable now. Plan to AROM soon. #Pain: Epidural #FWB: Cat I  #GBS positive, on PCN  #H/o preE: BP normotensive, will cont to monitor  #Asthma  [name], MD 9:07 PM

## 2022-10-18 NOTE — Progress Notes (Addendum)
Labor Progress Note Margaret Sanchez is a 32 y.o. [id number] at [redacted]w[redacted]d presented for IOL iso hx IUFD.  S: Doing well, comfortable at this time.  O:  BP 105/66   Pulse 65   Resp 16   Ht 5\' 4"  (1.626 m)   Wt 90.7 kg   LMP 01/18/2022   SpO2 99%   BMI 34.33 kg/m  EFM: 135/mod/+a/early decels  CVE: Dilation: 4 Effacement (%): 60 Cervical Position: Middle Station: -3 Presentation: Vertex Exam by:: Brynda Greathouse, RN   A&P: 32 y.o. [id number] [redacted]w[redacted]d here for IOL iso hx IUFD #Labor: Progressing well. Patient verbally consented to AROM, AROM performed with bloody to mucusy fluid.  #Pain: Epidural #FWB: Cat I  #GBS positive, on PCN  #H/o preE: BP normotensive, will cont to monitor  #Asthma  [name], MD 9:43 PM

## 2022-10-19 ENCOUNTER — Encounter (HOSPITAL_COMMUNITY): Payer: Self-pay | Admitting: Obstetrics and Gynecology

## 2022-10-19 MED ORDER — SODIUM CHLORIDE 0.9 % IV SOLN: 250.0000 mL | INTRAVENOUS | Status: DC | PRN

## 2022-10-19 MED ORDER — ONDANSETRON HCL 4 MG PO TABS: 4.0000 mg | ORAL_TABLET | ORAL | Status: DC | PRN

## 2022-10-19 MED ORDER — SODIUM CHLORIDE 0.9% FLUSH: 3.0000 mL | Freq: Two times a day (BID) | INTRAVENOUS | Status: DC

## 2022-10-19 MED ORDER — ONDANSETRON HCL 4 MG/2ML IJ SOLN: 4.0000 mg | INTRAMUSCULAR | Status: DC | PRN

## 2022-10-19 MED ORDER — SIMETHICONE 80 MG PO CHEW: 80.0000 mg | CHEWABLE_TABLET | ORAL | Status: DC | PRN

## 2022-10-19 MED ORDER — COCONUT OIL OIL: 1.0000 | TOPICAL_OIL | Status: DC | PRN

## 2022-10-19 MED ORDER — WITCH HAZEL-GLYCERIN EX PADS: 1.0000 | MEDICATED_PAD | CUTANEOUS | Status: DC | PRN

## 2022-10-19 MED ORDER — ACETAMINOPHEN 325 MG PO TABS
650.0000 mg | ORAL_TABLET | ORAL | Status: DC | PRN
  Administered 2022-10-19 (×2): 650 mg via ORAL
  Filled 2022-10-19 (×2): qty 2

## 2022-10-19 MED ORDER — SODIUM CHLORIDE 0.9% FLUSH: 3.0000 mL | INTRAVENOUS | Status: DC | PRN

## 2022-10-19 MED ORDER — SENNOSIDES-DOCUSATE SODIUM 8.6-50 MG PO TABS
2.0000 | ORAL_TABLET | ORAL | Status: DC
  Administered 2022-10-19 – 2022-10-20 (×2): 2 via ORAL
  Filled 2022-10-19 (×2): qty 2

## 2022-10-19 MED ORDER — PNEUMOCOCCAL 20-VAL CONJ VACC 0.5 ML IM SUSY: 0.5000 mL | PREFILLED_SYRINGE | INTRAMUSCULAR | Status: DC

## 2022-10-19 MED ORDER — MEDROXYPROGESTERONE ACETATE 150 MG/ML IM SUSP
150.0000 mg | INTRAMUSCULAR | Status: AC | PRN
  Administered 2022-10-20: 150 mg via INTRAMUSCULAR
  Filled 2022-10-19: qty 1

## 2022-10-19 MED ORDER — DIPHENHYDRAMINE HCL 25 MG PO CAPS: 25.0000 mg | ORAL_CAPSULE | Freq: Four times a day (QID) | ORAL | Status: DC | PRN

## 2022-10-19 MED ORDER — DIBUCAINE (PERIANAL) 1 % EX OINT: 1.0000 | TOPICAL_OINTMENT | CUTANEOUS | Status: DC | PRN

## 2022-10-19 MED ORDER — INFLUENZA VIRUS VACC SPLIT PF (FLUZONE) 0.5 ML IM SUSY
0.5000 mL | PREFILLED_SYRINGE | Freq: Once | INTRAMUSCULAR | Status: AC
  Administered 2022-10-19: 0.5 mL via INTRAMUSCULAR
  Filled 2022-10-19: qty 0.5

## 2022-10-19 MED ORDER — PRENATAL MULTIVITAMIN CH
1.0000 | ORAL_TABLET | Freq: Every day | ORAL | Status: DC
  Administered 2022-10-19: 1 via ORAL
  Filled 2022-10-19: qty 1

## 2022-10-19 MED ORDER — IBUPROFEN 600 MG PO TABS
600.0000 mg | ORAL_TABLET | Freq: Four times a day (QID) | ORAL | Status: DC
  Administered 2022-10-19 – 2022-10-20 (×6): 600 mg via ORAL
  Filled 2022-10-19 (×7): qty 1

## 2022-10-19 MED ORDER — BENZOCAINE-MENTHOL 20-0.5 % EX AERO: 1.0000 | INHALATION_SPRAY | CUTANEOUS | Status: DC | PRN

## 2022-10-19 NOTE — Discharge Summary (Signed)
Postpartum Discharge Summary  Date of Service updated***     Patient Name: Margaret Sanchez DOB: [birth year] MRN: [id number]  Date of admission: 10/18/2022 Delivery date:10/18/2022 Delivering provider: [name] Date of discharge: 10/19/2022  Admitting diagnosis: History of IUFD [Z87.59] Intrauterine pregnancy: [redacted]w[redacted]d     Secondary diagnosis:  Principal Problem:   SVD (spontaneous vaginal delivery) Active Problems:   History of IUFD  Additional problems: ***    Discharge diagnosis: Term Pregnancy Delivered                                              Post partum procedures:{Postpartum procedures:23558} Augmentation: Cytotec, AROM Complications: {OB Labor/Delivery Complications:20784}  Hospital course: Induction of Labor With Vaginal Delivery   32 y.o. yo [phone] at [redacted]w[redacted]d was admitted to the hospital 10/18/2022 for induction of labor.  Indication for induction:  history of IUFD .  Patient had an uncomplicated labor course. Membrane Rupture Time/Date: 9:26 PM,10/18/2022  Delivery Method:Vaginal, Spontaneous Operative Delivery:N/A Episiotomy: None Lacerations:  None Details of delivery can be found in separate delivery note.  Patient had a postpartum course complicated by***. Patient is discharged home 10/19/22.  Newborn Data: Birth date:10/18/2022 Birth time:11:50 PM Gender:Female Living status:Living Apgars:7 ,9  Weight:   Magnesium Sulfate received: No BMZ received: No Rhophylac:N/A MMR:N/A T-DaP:Given prenatally Flu: N/A Transfusion:{Transfusion received:30440034}  Immunizations received: Immunization History  Administered Date(s) Administered   Influenza,inj,Quad PF,6+ Mos 04/02/2022   Tdap 08/26/2022    Physical exam  Vitals:   10/18/22 2330 10/18/22 2340 10/18/22 2345 10/19/22 0020  BP: 103/69 114/70  113/64  Pulse: 69 66  77  Resp:      SpO2:   98%   Weight:      Height:       General: {Exam; general:21111117} Lochia: {Desc;  appropriate/inappropriate:30686::"appropriate"} Uterine Fundus: {Desc; firm/soft:30687} Incision: {Exam; incision:21111123} DVT Evaluation: {Exam; dvt:2111122} Labs: Lab Results  Component Value Date   WBC 6.5 10/18/2022   HGB 9.6 (L) 10/18/2022   HCT 29.7 (L) 10/18/2022   MCV 83.0 10/18/2022   PLT 310 10/18/2022      Latest Ref Rng & Units 08/24/2022   11:34 PM  CMP  Glucose 70 - 99 mg/dL 79   BUN 6 - 20 mg/dL 7   Creatinine 9.62 - 9.52 mg/dL 8.41   Sodium 324 - 401 mmol/L 139   Potassium 3.5 - 5.1 mmol/L 3.9   Chloride 98 - 111 mmol/L 103   CO2 22 - 32 mmol/L 22   Calcium 8.9 - 10.3 mg/dL 8.5   Total Protein 6.5 - 8.1 g/dL 5.9   Total Bilirubin 0.3 - 1.2 mg/dL 0.8   Alkaline Phos 38 - 126 U/L 101   AST 15 - 41 U/L 14   ALT 0 - 44 U/L 11    Edinburgh Score:     No data to display         No data recorded  After visit meds:  Allergies as of 10/19/2022   No Known Allergies   Med Rec must be completed prior to using this [facility]***        Discharge home in stable condition Infant Feeding: {Baby feeding:23562} Infant Disposition:{CHL IP OB HOME WITH UUVOZD:66440} Discharge instruction: per After Visit Summary and Postpartum booklet. Activity: Advance as tolerated. Pelvic rest for 6 weeks.  Diet: {OB HKVQ:25956387} Future Appointments:No future appointments.  Follow up Visit: Message sent to [facility] 9/21  Please schedule this patient for a In person postpartum visit in 6 weeks with the following provider: Any provider. Additional Postpartum F/U: n/a   Low risk pregnancy complicated by:  h/o pre-e, h/o IUFD Delivery mode:  Vaginal, Spontaneous Anticipated Birth Control:  Depo   10/19/2022 [name], MD

## 2022-10-19 NOTE — Progress Notes (Signed)
CSW received consult for hx of Depression.  CSW met with MOB to offer support and complete assessment. FOB present in the bathroom. CSW introduced self. MOB granted CSW verbal permission to speak about anything while FOB was in the bathroom. CSW explained reason for the consult. MOB was welcoming, pleasant, and remained engaged during assessment. CSW and MOB discussed MOB's mental health history. MOB reported that she was diagnosed with depression in 2021 after having a stillborn. CSW offered condolences for MOB's loss. MOB reported that she participated in grief counseling which was helpful. MOB endorsed experiencing postpartum depression after having her daughter 2 years ago. MOB described her postpartum depression as sadness and happiness and shared that her grief played a role in her feelings. CSW inquired about MOB's treatment for PPD, MOB reported that she talked with her Renato Gails which was helpful. MOB denied any additional mental health history. MOB denied any current symptoms of depression. CSW inquired about how MOB was feeling emotionally since giving birth, MOB reported that she was feeling bittersweet. MOB shared that she was triggered as this is where she had her stillborn and reported that she is now okay. CSW acknowledged, validated, and normalized MOB's feelings. MOB presented calm and did not demonstrate any acute mental health signs/symptoms. CSW assessed for safety, MOB denied SI and HI. CSW did not assess for domestic violence as MOB was not alone. CSW inquired about MOB's support system, MOB reported that FOB is a support.   CSW provided education regarding the baby blues period vs. perinatal mood disorders, discussed treatment and gave resources for mental health follow up if concerns arise.  CSW recommends self-evaluation during the postpartum time period using the New Mom Checklist from Postpartum Progress and encouraged MOB to contact a medical professional if symptoms are noted at any  time.    CSW provided review of Sudden Infant Death Syndrome (SIDS) precautions. MOB verbalized understanding and reported having all items needed to care for infant including a car seat and crib.   CSW identifies no further need for intervention and no barriers to discharge at this time.  Celso Sickle, LCSW Clinical Social Worker [facility] Cell#: [phone]

## 2022-10-19 NOTE — Anesthesia Postprocedure Evaluation (Signed)
Anesthesia Post Note  Patient: Margaret Sanchez  Procedure(s) Performed: AN AD HOC LABOR EPIDURAL     Patient location during evaluation: Mother Baby Anesthesia Type: Epidural Level of consciousness: awake Pain management: satisfactory to patient Vital Signs Assessment: post-procedure vital signs reviewed and stable Respiratory status: spontaneous breathing Cardiovascular status: stable Anesthetic complications: no  No notable events documented.  Last Vitals:  Vitals:   10/19/22 0508 10/19/22 0845  BP: 115/73 119/77  Pulse: (!) 58 63  Resp: 14 16  Temp: 36.7 C 36.6 C  SpO2: 98% 100%    Last Pain:  Vitals:   10/19/22 0845  TempSrc: Oral  PainSc: 3    Pain Goal: Patients Stated Pain Goal: 2 (10/19/22 0845)                 Cephus Shelling

## 2022-10-19 NOTE — Progress Notes (Signed)
POSTPARTUM PROGRESS NOTE  Post Partum Day 1  Subjective:  Margaret Sanchez is a 32 y.o. [id number] s/p SVD at [redacted]w[redacted]d.  She reports she is doing well. No acute events overnight. She denies any problems with ambulating, voiding or po intake. Denies nausea or vomiting.  Pain is well controlled.  Lochia is normal.  Objective: Blood pressure 115/73, pulse (!) 58, temperature 98 F (36.7 C), resp. rate 14, height 5\' 4"  (1.626 m), weight 90.7 kg, last menstrual period 01/18/2022, SpO2 98%, unknown if currently breastfeeding.  Physical Exam:  General: alert, cooperative and no distress Chest: no respiratory distress Heart:regular rate, distal pulses intact Uterine Fundus: firm, appropriately tender DVT Evaluation: No calf swelling or tenderness Extremities: No edema Skin: warm, dry  Recent Labs    10/18/22 1330  HGB 9.6*  HCT 29.7*    Assessment/Plan: Margaret Sanchez is a 32 y.o. [phone] s/p SVD at [redacted]w[redacted]d   PPD#1 - Doing well  Routine postpartum care H/o Pre-e in prev preg - Watching BP, normotensive thus far Asthma - no sxs Contraception: Depo Feeding: formula Dispo: Plan for discharge tomorrow.   LOS: 1 day   Margaret Aland, MD OB Fellow  10/19/2022, 7:18 AM

## 2022-10-20 MED ORDER — IBUPROFEN 600 MG PO TABS: 600.0000 mg | ORAL_TABLET | Freq: Four times a day (QID) | ORAL | 0 refills | Status: AC

## 2022-10-23 ENCOUNTER — Encounter: Payer: Self-pay | Admitting: Obstetrics & Gynecology

## 2022-11-13 ENCOUNTER — Telehealth (HOSPITAL_COMMUNITY): Payer: Self-pay | Admitting: *Deleted

## 2022-11-13 NOTE — Telephone Encounter (Signed)
11/13/2022  Name: Kennesha Brewbaker MRN: [id number] DOB: 09-11-1990  Reason for Call:  Transition of Care Hospital Discharge Call  Contact Status: Patient Contact Status: Complete  Language assistant needed:          Follow-Up Questions: Do You Have Any Concerns About Your Health As You Heal From Delivery?: No Do You Have Any Concerns About Your Infants Health?: No  Edinburgh Postnatal Depression Scale:  In the Past 7 Days:   Patient declined, stating, "I completed it with my pediatrician. I feel fine." No areas of concern reported when EPDS was completed with pediatrician, per patient report. PHQ2-9 Depression Scale:     Discharge Follow-up: Edinburgh score requires follow up?: N/A Patient was advised of the following resources:: Breastfeeding Support Group, Support Group  Post-discharge interventions: Reviewed Newborn Safe Sleep Practices  Signature Deforest Hoyles, RN, 11/13/22, [phone]

## 2022-12-04 ENCOUNTER — Encounter (HOSPITAL_COMMUNITY): Payer: Self-pay | Admitting: Emergency Medicine

## 2022-12-04 ENCOUNTER — Ambulatory Visit (HOSPITAL_COMMUNITY)
Admission: EM | Admit: 2022-12-04 | Discharge: 2022-12-04 | Disposition: A | Discharge status: Home / Self Care | Payer: Medicaid Other | Attending: Physician Assistant | Admitting: Physician Assistant

## 2022-12-04 DIAGNOSIS — J4521 Mild intermittent asthma with (acute) exacerbation: Secondary | ICD-10-CM

## 2022-12-04 DIAGNOSIS — J069 Acute upper respiratory infection, unspecified: Secondary | ICD-10-CM

## 2022-12-04 LAB — POC COVID19/FLU A&B COMBO
Covid Antigen, POC: NEGATIVE
Influenza A Antigen, POC: NEGATIVE
Influenza B Antigen, POC: NEGATIVE

## 2022-12-04 MED ORDER — ONDANSETRON 4 MG PO TBDP
ORAL_TABLET | ORAL | Status: AC
  Filled 2022-12-04: qty 1

## 2022-12-04 MED ORDER — ALBUTEROL SULFATE HFA 108 (90 BASE) MCG/ACT IN AERS
INHALATION_SPRAY | RESPIRATORY_TRACT | Status: AC
  Filled 2022-12-04: qty 6.7

## 2022-12-04 MED ORDER — ALBUTEROL SULFATE HFA 108 (90 BASE) MCG/ACT IN AERS
2.0000 | INHALATION_SPRAY | Freq: Once | RESPIRATORY_TRACT | Status: AC
  Administered 2022-12-04: 2 via RESPIRATORY_TRACT

## 2022-12-04 MED ORDER — ONDANSETRON 4 MG PO TBDP
4.0000 mg | ORAL_TABLET | Freq: Once | ORAL | Status: AC
  Administered 2022-12-04: 4 mg via ORAL

## 2022-12-04 MED ORDER — PROMETHAZINE-DM 6.25-15 MG/5ML PO SYRP: 5.0000 mL | ORAL_SOLUTION | Freq: Three times a day (TID) | ORAL | 0 refills | Status: AC | PRN

## 2022-12-04 MED ORDER — PREDNISONE 20 MG PO TABS: 40.0000 mg | ORAL_TABLET | Freq: Every day | ORAL | 0 refills | Status: AC

## 2022-12-04 NOTE — ED Provider Notes (Signed)
[facility]    CSN: [id number] Arrival date & time: 12/04/22  1855      History   Chief Complaint Chief Complaint  Patient presents with   Cough   Nasal Congestion    HPI Margaret Sanchez is a 32 y.o. female.   Patient presents today with a 4 to 5-day history of URI symptoms.  She reports cough, congestion, chills, headache, nausea, vomiting last episode earlier today, shortness of breath.  Denies any chest pain, abdominal pain, diarrhea, weakness.  She has had COVID several years ago but not more recently.  Denies any known sick contacts.  She does have a history of asthma and has used her albuterol inhaler more frequently since her symptoms began.  Denies any recent antibiotics or steroids.  She did recently give birth approximately 6 weeks ago.  She is confident that she is not pregnant and is not currently breast-feeding.  She has not tried any over-the-counter medication for symptom management.  She denies previous hospitalization related to asthma.  She is having difficulty with her daily activities as result of symptoms.    Past Medical History:  Diagnosis Date   Asthma    Depression    History of chlamydia    Hypertension    postpartum   Mild intermittent asthma 12/28/2018    Patient Active Problem List   Diagnosis Date Noted   SVD (spontaneous vaginal delivery) 10/19/2022   Group B Streptococcus carrier, +RV culture, currently pregnant 09/30/2022   Anemia in pregnancy, third trimester 08/25/2022   Supervision of high-risk pregnancy 03/18/2022   Hx of preeclampsia, prior pregnancy, currently pregnant 09/07/2019   History of IUFD 09/01/2019    Past Surgical History:  Procedure Laterality Date   ADENOIDECTOMY     arm surgery Right    TONSILLECTOMY      OB History     Gravida  4   Para  4   Term  3   Preterm  1   AB      Living  3      SAB      IAB      Ectopic      Multiple  0   Live Births  3            Home  Medications    Prior to Admission medications   Medication Sig Start Date End Date Taking? Authorizing Provider  predniSONE (DELTASONE) 20 MG tablet Take 2 tablets (40 mg total) by mouth daily for 5 days. 12/04/22 12/09/22 Yes Jeriah Corkum K, PA-C  promethazine-dextromethorphan (PROMETHAZINE-DM) 6.25-15 MG/5ML syrup Take 5 mLs by mouth 3 (three) times daily as needed for cough. 12/04/22  Yes Laurinda Carreno K, PA-C  albuterol (VENTOLIN HFA) 108 (90 Base) MCG/ACT inhaler Inhale 2 puffs into the lungs every 6 (six) hours as needed for wheezing or shortness of breath. Patient not taking: Reported on 10/03/2022    [provider]  Blood Pressure Monitoring (BLOOD PRESSURE KIT) KIT 1 Device by Does not apply route as needed. Patient not taking: Reported on 09/26/2022 09/07/19   Sheila Oats, MD  ferrous sulfate 325 (65 FE) MG EC tablet Take 1 tablet (325 mg total) by mouth every other day. 08/25/22   Gerrit Heck, CNM  ibuprofen (ADVIL) 600 MG tablet Take 1 tablet (600 mg total) by mouth every 6 (six) hours. Patient not taking: Reported on 12/04/2022 10/20/22   Reva Bores, MD  Prenatal Vit-Fe Fumarate-FA (PRENATAL MULTIVITAMIN) TABS tablet Take  1 tablet by mouth daily at 12 noon.    [provider]    Family History Family History  Problem Relation Age of Onset   Hypertension Mother    Asthma Sister    Asthma Brother    Asthma Brother    Asthma Brother    Asthma Brother    Diabetes Maternal Grandmother    Hypertension Maternal Grandmother    Hypertension Paternal Grandmother    Stroke Neg Hx     Social History Social History   Tobacco Use   Smoking status: Never   Smokeless tobacco: Never  Vaping Use   Vaping status: Never Used  Substance Use Topics   Alcohol use: Not Currently    Comment: occasionally, prior to pregnancy   Drug use: Not Currently    Types: Marijuana    Comment: not since confirmed pregnancy     Allergies   Patient has no known  allergies.   Review of Systems Review of Systems  Constitutional:  Positive for activity change and chills. Negative for appetite change, fatigue and fever.  HENT:  Positive for congestion and postnasal drip. Negative for sinus pressure, sneezing and sore throat.   Respiratory:  Positive for cough and shortness of breath.   Cardiovascular:  Negative for chest pain.  Gastrointestinal:  Positive for nausea and vomiting. Negative for abdominal pain and diarrhea.  Musculoskeletal:  Negative for arthralgias and myalgias.  Neurological:  Negative for dizziness, light-headedness and headaches.     Physical Exam Triage Vital Signs ED Triage Vitals  Encounter Vitals Group     BP 12/04/22 1918 133/82     Systolic BP Percentile --      Diastolic BP Percentile --      Pulse Rate 12/04/22 1918 92     Resp 12/04/22 1918 17     Temp 12/04/22 1918 98.3 F (36.8 C)     Temp Source 12/04/22 1918 Oral     SpO2 12/04/22 1918 98 %     Weight --      Height --      Head Circumference --      Peak Flow --      Pain Score 12/04/22 1919 10     Pain Loc --      Pain Education --      Exclude from Growth Chart --    No data found.  Updated Vital Signs BP 133/82 (BP Location: Right Arm)   Pulse 92   Temp 98.3 F (36.8 C) (Oral)   Resp 17   LMP 01/18/2022   SpO2 98%   Visual Acuity Right Eye Distance:   Left Eye Distance:   Bilateral Distance:    Right Eye Near:   Left Eye Near:    Bilateral Near:     Physical Exam Vitals reviewed.  Constitutional:      General: She is awake. She is not in acute distress.    Appearance: Normal appearance. She is well-developed. She is not ill-appearing.     Comments: Very pleasant female appears stated age in no acute distress sitting comfortably in exam room  HENT:     Head: Normocephalic and atraumatic.     Right Ear: Tympanic membrane, ear canal and external ear normal. Tympanic membrane is not erythematous or bulging.     Left Ear: Tympanic  membrane, ear canal and external ear normal. Tympanic membrane is not erythematous or bulging.     Nose:     Right Sinus: No maxillary  sinus tenderness or frontal sinus tenderness.     Left Sinus: No maxillary sinus tenderness or frontal sinus tenderness.     Mouth/Throat:     Pharynx: Uvula midline. No oropharyngeal exudate or posterior oropharyngeal erythema.  Cardiovascular:     Rate and Rhythm: Normal rate and regular rhythm.     Heart sounds: Normal heart sounds, S1 normal and S2 normal. No murmur heard. Pulmonary:     Effort: Pulmonary effort is normal.     Breath sounds: Examination of the right-lower field reveals decreased breath sounds. Examination of the left-lower field reveals decreased breath sounds. Decreased breath sounds present. No wheezing, rhonchi or rales.  Psychiatric:        Behavior: Behavior is cooperative.      UC Treatments / Results  Labs (all labs ordered are listed, but only abnormal results are displayed) Labs Reviewed  POC COVID19/FLU A&B COMBO    EKG   Radiology No results found.  Procedures Procedures (including critical care time)  Medications Ordered in UC Medications  ondansetron (ZOFRAN-ODT) disintegrating tablet 4 mg (4 mg Oral Given 12/04/22 1935)  albuterol (VENTOLIN HFA) 108 (90 Base) MCG/ACT inhaler 2 puff (2 puffs Inhalation Given 12/04/22 1935)    Initial Impression / Assessment and Plan / UC Course  I have reviewed the triage vital signs and the nursing notes.  Pertinent labs & imaging results that were available during my care of the patient were reviewed by me and considered in my medical decision making (see chart for details).     Patient is well-appearing, afebrile, nontoxic, nontachycardic.  COVID and flu testing was negative.  No evidence of acute infection on physical exam that warrant initiation of antibiotics.  She was given albuterol in clinic with improvement of symptoms.  Suspect she has a viral illness that has  triggered her asthma.  She is also given Zofran to help with her nausea with resolution of the symptoms and was able to pass oral challenge.  Recommend she use Promethazine DM for cough and nausea we discussed that this can be sedating and she is not to drive or drink alcohol while taking it.  She was also started on prednisone burst to treat asthma exacerbation with instructions to take NSAIDs with this medicine due to risk of GI bleeding.  She can use Mucinex, Flonase, Tylenol for additional symptom relief.  If her symptoms are improving within a week she is to return for reevaluation.  If she has any worsening symptoms she needs to be seen immediately.  Strict return precautions given.  Work excuse note provided.  Final Clinical Impressions(s) / UC Diagnoses   Final diagnoses:  Mild intermittent asthma with acute exacerbation  Upper respiratory tract infection, unspecified type     Discharge Instructions      I think you have a virus that triggered your asthma.  Continue your albuterol every 4-6 hours as needed.  Start prednisone 40 mg for 5 days.  Do not take NSAIDs with this medication including aspirin, ibuprofen/Advil, naproxen/Aleve.  Use Promethazine DM for cough.  This will make you sleepy so do not drive or drink alcohol while taking it.  Make sure that you rest and drink plenty of fluid.  If your symptoms are not improving within a week please return for reevaluation.  If anything worsens and you have worsening cough, shortness of breath, wheezing despite medication, nausea/vomiting interfering with oral intake, weakness you need to be seen immediately.     ED Prescriptions  Medication Sig Dispense Auth. Provider   promethazine-dextromethorphan (PROMETHAZINE-DM) 6.25-15 MG/5ML syrup Take 5 mLs by mouth 3 (three) times daily as needed for cough. 118 mL Sumire Halbleib K, PA-C   predniSONE (DELTASONE) 20 MG tablet Take 2 tablets (40 mg total) by mouth daily for 5 days. 10 tablet  [facility], Noberto Retort, PA-C      PDMP not reviewed this encounter.   Jeani Hawking, PA-C 12/04/22 2000

## 2022-12-04 NOTE — ED Triage Notes (Signed)
Pt c/o cough, SOB, runny nose, sweating since Friday. She also threw up once today

## 2022-12-04 NOTE — Discharge Instructions (Signed)
I think you have a virus that triggered your asthma.  Continue your albuterol every 4-6 hours as needed.  Start prednisone 40 mg for 5 days.  Do not take NSAIDs with this medication including aspirin, ibuprofen/Advil, naproxen/Aleve.  Use Promethazine DM for cough.  This will make you sleepy so do not drive or drink alcohol while taking it.  Make sure that you rest and drink plenty of fluid.  If your symptoms are not improving within a week please return for reevaluation.  If anything worsens and you have worsening cough, shortness of breath, wheezing despite medication, nausea/vomiting interfering with oral intake, weakness you need to be seen immediately.

## 2022-12-05 ENCOUNTER — Other Ambulatory Visit: Payer: Self-pay

## 2022-12-05 ENCOUNTER — Ambulatory Visit: Payer: Medicaid Other | Admitting: Obstetrics and Gynecology

## 2022-12-05 DIAGNOSIS — Z3042 Encounter for surveillance of injectable contraceptive: Secondary | ICD-10-CM
